# Patient Record
Sex: Female | Born: 1970 | Race: White | Hispanic: No | Marital: Married | Smoking: Never smoker
Health system: Southern US, Community
[De-identification: ages and names within clinical notes are randomized; demographics above are authoritative.]

## PROBLEM LIST (undated history)

## (undated) HISTORY — PX: TUBAL LIGATION: SHX77

## (undated) HISTORY — PX: OTHER SURGICAL HISTORY: SHX169

---

## 2002-04-30 ENCOUNTER — Emergency Department (HOSPITAL_COMMUNITY): Admission: EM | Admit: 2002-04-30 | Discharge: 2002-04-30 | Payer: Self-pay | Admitting: Emergency Medicine

## 2003-08-12 ENCOUNTER — Emergency Department (HOSPITAL_COMMUNITY): Admission: EM | Admit: 2003-08-12 | Discharge: 2003-08-12 | Payer: Self-pay | Admitting: Emergency Medicine

## 2003-09-01 ENCOUNTER — Other Ambulatory Visit: Admission: RE | Admit: 2003-09-01 | Discharge: 2003-09-01 | Payer: Self-pay | Admitting: Obstetrics & Gynecology

## 2004-09-09 ENCOUNTER — Other Ambulatory Visit: Admission: RE | Admit: 2004-09-09 | Discharge: 2004-09-09 | Payer: Self-pay | Admitting: Obstetrics & Gynecology

## 2007-08-06 ENCOUNTER — Ambulatory Visit (HOSPITAL_COMMUNITY): Admission: RE | Admit: 2007-08-06 | Discharge: 2007-08-06 | Payer: Self-pay | Admitting: Family Medicine

## 2011-02-20 ENCOUNTER — Other Ambulatory Visit: Payer: Self-pay | Admitting: Obstetrics & Gynecology

## 2011-02-20 DIAGNOSIS — R928 Other abnormal and inconclusive findings on diagnostic imaging of breast: Secondary | ICD-10-CM

## 2011-02-27 ENCOUNTER — Ambulatory Visit
Admission: RE | Admit: 2011-02-27 | Discharge: 2011-02-27 | Disposition: A | Discharge status: Home / Self Care | Payer: BC Managed Care – PPO | Source: Ambulatory Visit | Attending: Obstetrics & Gynecology | Admitting: Obstetrics & Gynecology

## 2011-02-27 DIAGNOSIS — R928 Other abnormal and inconclusive findings on diagnostic imaging of breast: Secondary | ICD-10-CM

## 2013-06-22 ENCOUNTER — Encounter (INDEPENDENT_AMBULATORY_CARE_PROVIDER_SITE_OTHER): Payer: Self-pay | Admitting: *Deleted

## 2013-07-12 ENCOUNTER — Encounter (INDEPENDENT_AMBULATORY_CARE_PROVIDER_SITE_OTHER): Payer: Self-pay | Admitting: Internal Medicine

## 2013-07-12 ENCOUNTER — Ambulatory Visit (INDEPENDENT_AMBULATORY_CARE_PROVIDER_SITE_OTHER): Payer: BC Managed Care – PPO | Admitting: Internal Medicine

## 2013-07-12 VITALS — BP 116/78 | HR 68 | Temp 98.2°F | Ht 65.0 in | Wt 201.8 lb

## 2013-07-12 DIAGNOSIS — Z800 Family history of malignant neoplasm of digestive organs: Secondary | ICD-10-CM

## 2013-07-12 DIAGNOSIS — K625 Hemorrhage of anus and rectum: Secondary | ICD-10-CM | POA: Insufficient documentation

## 2013-07-12 NOTE — Progress Notes (Signed)
Subjective:     Patient ID: Kathy Dennis, female   DOB: 06/10/1970, 43 y.o.   MRN: [id number]  HPI Referred to our office by Dr Regino SchultzeMcGough for diarrhea. Back in May she states she had stomach cramps and diarrhea. She says she saw some blood. Her symptoms lasted about X 1 week for about a week. Occurred around mid May.  She was having multiple stools. She saw blood around the stool.   She did have a fever with her symptoms. All her symptoms are now gone. She tells me she had a maternal grandmother to die from colon cancer at age 44-65.Died in her 770s with reoccurrence. Appetite is good. She has lost 13 pounds intentionally. No abdominal pain.  She is having 1 BM daily or every other day. Occasionally sees mucous once every 3 weeks (clear-brownish in color). No pain with the mucous. No anitibiotics.  05/21/2013 ALP 102, AST 21, ALT 20,total bilirubin 0.5, albumin 4.2, Glucose 128.  H and H 12.7 and 38,.4, WBC ct 7.4, platelet ct 241.   05/23/2013 C -diff negative, No ova or para Review of Systems History reviewed. No pertinent past medical history.  Past Surgical History  Procedure Laterality Date  . Tubal ligation      1998    Allergies  Allergen Reactions  . Sulfa Antibiotics     rash    No current outpatient prescriptions on file prior to visit.   No current facility-administered medications on file prior to visit.        Objective:   Physical Exam  Filed Vitals:   07/12/13 1423  BP: 116/78  Pulse: 68  Temp: 98.2 F (36.8 C)  Height: 5\' 5"  (1.651 m)  Weight: 201 lb 12.8 oz (91.536 kg)  Alert and oriented. Skin warm and dry. Oral mucosa is moist.   . Sclera anicteric, conjunctivae is pink. Thyroid not enlarged. No cervical lymphadenopathy. Lungs clear. Heart regular rate and rhythm.  Abdomen is soft. Bowel sounds are positive. No hepatomegaly. No abdominal masses felt. No tenderness.  No edema to lower extremities.  Stool brown and guaiac negative.      Assessment:   Probably gastroenteritis which has now resolved.  Rectal bleeding probably related to her gastroenteritis which has now resolved. Family hx of colon cancer in a maternal grandmother Patient would like to proceed with a colonoscopy given family hx of colon cancer. I discussed with Dr. Karilyn Cotaehman     Plan:     Colonoscopy with Dr. Karilyn Cotaehman. The risks and benefits such as perforation, bleeding, and infection were reviewed with the patient and is agreeable.

## 2013-07-12 NOTE — Patient Instructions (Signed)
Colonoscopy with Dr. Rehman. The risks and benefits such as perforation, bleeding, and infection were reviewed with the patient and is agreeable. 

## 2013-07-25 ENCOUNTER — Encounter (INDEPENDENT_AMBULATORY_CARE_PROVIDER_SITE_OTHER): Payer: Self-pay | Admitting: *Deleted

## 2013-07-25 ENCOUNTER — Other Ambulatory Visit (INDEPENDENT_AMBULATORY_CARE_PROVIDER_SITE_OTHER): Payer: Self-pay | Admitting: *Deleted

## 2013-07-25 ENCOUNTER — Telehealth (INDEPENDENT_AMBULATORY_CARE_PROVIDER_SITE_OTHER): Payer: Self-pay | Admitting: *Deleted

## 2013-07-25 DIAGNOSIS — Z800 Family history of malignant neoplasm of digestive organs: Secondary | ICD-10-CM

## 2013-07-25 DIAGNOSIS — Z1211 Encounter for screening for malignant neoplasm of colon: Secondary | ICD-10-CM

## 2013-07-25 DIAGNOSIS — K625 Hemorrhage of anus and rectum: Secondary | ICD-10-CM

## 2013-07-25 NOTE — Telephone Encounter (Signed)
Patient needs movi prep 

## 2013-07-26 MED ORDER — PEG-KCL-NACL-NASULF-NA ASC-C 100 G PO SOLR: 1.0000 | Freq: Once | ORAL | Status: DC

## 2013-08-01 ENCOUNTER — Encounter (HOSPITAL_COMMUNITY): Payer: Self-pay | Admitting: Pharmacy Technician

## 2013-08-04 ENCOUNTER — Ambulatory Visit (HOSPITAL_COMMUNITY)
Admission: RE | Admit: 2013-08-04 | Discharge: 2013-08-04 | Disposition: A | Discharge status: Home / Self Care | Payer: BC Managed Care – PPO | Source: Ambulatory Visit | Attending: Internal Medicine | Admitting: Internal Medicine

## 2013-08-04 ENCOUNTER — Encounter (HOSPITAL_COMMUNITY): Payer: Self-pay | Admitting: *Deleted

## 2013-08-04 ENCOUNTER — Encounter (HOSPITAL_COMMUNITY)
Admission: RE | Disposition: A | Discharge status: Home / Self Care | Payer: Self-pay | Source: Ambulatory Visit | Attending: Internal Medicine

## 2013-08-04 DIAGNOSIS — Z800 Family history of malignant neoplasm of digestive organs: Secondary | ICD-10-CM

## 2013-08-04 DIAGNOSIS — K921 Melena: Secondary | ICD-10-CM | POA: Insufficient documentation

## 2013-08-04 DIAGNOSIS — K644 Residual hemorrhoidal skin tags: Secondary | ICD-10-CM

## 2013-08-04 DIAGNOSIS — K625 Hemorrhage of anus and rectum: Secondary | ICD-10-CM

## 2013-08-04 HISTORY — PX: COLONOSCOPY: SHX5424

## 2013-08-04 SURGERY — COLONOSCOPY
Anesthesia: Moderate Sedation

## 2013-08-04 MED ORDER — MEPERIDINE HCL 50 MG/ML IJ SOLN
INTRAMUSCULAR | Status: AC
  Filled 2013-08-04: qty 1

## 2013-08-04 MED ORDER — MEPERIDINE HCL 50 MG/ML IJ SOLN
INTRAMUSCULAR | Status: DC | PRN
  Administered 2013-08-04 (×2): 25 mg via INTRAVENOUS

## 2013-08-04 MED ORDER — MIDAZOLAM HCL 5 MG/5ML IJ SOLN
INTRAMUSCULAR | Status: DC | PRN
  Administered 2013-08-04 (×5): 2 mg via INTRAVENOUS

## 2013-08-04 MED ORDER — MIDAZOLAM HCL 5 MG/5ML IJ SOLN
INTRAMUSCULAR | Status: AC
  Filled 2013-08-04: qty 10

## 2013-08-04 MED ORDER — STERILE WATER FOR IRRIGATION IR SOLN
Status: DC | PRN
  Administered 2013-08-04: 11:00:00

## 2013-08-04 MED ORDER — SODIUM CHLORIDE 0.9 % IV SOLN
INTRAVENOUS | Status: DC
  Administered 2013-08-04: 10:00:00 via INTRAVENOUS

## 2013-08-04 NOTE — H&P (Signed)
Kathy Dennis is an 43 y.o. female.   Chief Complaint: Patient is here for colonoscopy. HPI: Patient is a 43 year old Caucasian female present for diagnostic colonoscopy. She had an episode of tarry in May 2015 and passed some blood per she noted blood with her bowel movements even in her diarrhea resolved. She is here for undergoing colonoscopy. He denies history of hemorrhoids. She has good appetite. She has lost 15 pounds this year by trying. History significant for colon carcinoma in maternal grandmother who was in early 61s and died second primary 41 years later.  History reviewed. No pertinent past medical history.  Past Surgical History  Procedure Laterality Date  . Tubal ligation      1998  . D & c      History reviewed. No pertinent family history. Social History:  reports that she has never smoked. She does not have any smokeless tobacco history on file. She reports that she does not drink alcohol or use illicit drugs.  Allergies:  Allergies  Allergen Reactions  . Sulfa Antibiotics     rash    Medications Prior to Admission  Medication Sig Dispense Refill  . peg 3350 powder (MOVIPREP) 100 G SOLR Take 1 kit (200 g total) by mouth once.  1 kit  0  . EPINEPHrine (EPIPEN) 0.3 mg/0.3 mL IJ SOAJ injection Inject 0.3 mg into the muscle once as needed (allergic reaction).        No results found for this or any previous visit (from the past 48 hour(s)). No results found.  ROS  Blood pressure 125/79, pulse 67, temperature 97.8 F (36.6 C), temperature source Oral, resp. rate 18, last menstrual period 08/04/2013, SpO2 96.00%. Physical Exam  Constitutional: She appears well-developed and well-nourished.  HENT:  Mouth/Throat: Oropharynx is clear and moist.  Eyes: Conjunctivae are normal. No scleral icterus.  Neck: No thyromegaly present.  Cardiovascular: Normal rate, regular rhythm and normal heart sounds.   No murmur heard. Respiratory: Effort normal and breath sounds  normal.  GI: Soft. She exhibits no distension and no mass. There is no tenderness.  Musculoskeletal: She exhibits no edema.  Lymphadenopathy:    She has no cervical adenopathy.  Neurological: She is alert.  Skin: Skin is warm and dry.     Assessment/Plan Rectal bleeding. Diagnostic colonoscopy.  Kathy Dennis 08/04/2013, 10:39 AM

## 2013-08-04 NOTE — Op Note (Signed)
COLONOSCOPY PROCEDURE REPORT  PATIENT:  Kathy Dennis  MR#:  [id number] Birthdate:  07/21/1970, 43 y.o., female Endoscopist:  Dr. Malissa HippoNajeeb U. Areeba Sulser, MD Referred By:  Dr. Kirk RuthsWilliam M. McGough, MD Procedure Date: 08/04/2013  Procedure:   Colonoscopy  Indications:  Patient is 43 year old Caucasian female who had an episode of diarrhea but rectal bleeding. She had a few more episodes of rectal bleeding even though her diarrhea has resolved. She is therefore undergoing diagnostic colonoscopy. Family history significant for colon carcinoma in maternal grandmother.  Informed Consent:  The procedure and risks were reviewed with the patient and informed consent was obtained.  Medications:  Demerol 50 mg IV Versed 10 mg IV  Description of procedure:  After a digital rectal exam was performed, that colonoscope was advanced from the anus through the rectum and colon to the area of the cecum, ileocecal valve and appendiceal orifice. The cecum was deeply intubated. These structures were well-seen and photographed for the record. From the level of the cecum and ileocecal valve, the scope was slowly and cautiously withdrawn. The mucosal surfaces were carefully surveyed utilizing scope tip to flexion to facilitate fold flattening as needed. The scope was pulled down into the rectum where a thorough exam including retroflexion was performed. Terminal ileum was also examined.  Findings:   Prep excellent. Normal mucosa of terminal ileum. Normal mucosa of the cecum, ascending colon, hepatic flexure, transverse colon, splenic flexure, descending and sigmoid colon. Normal mucosa rectum. Small hemorrhoids below the dentate line.    Therapeutic/Diagnostic Maneuvers Performed:   None  Complications:  None  Cecal Withdrawal Time:  8  minutes  Impression:  Normal mucosa of terminal ileum. Normal anoscopy except small external hemorrhoids.  Recommendations:  Standard instructions given. Patient can wait 10  years before her next colonoscopy.  Lear Carstens U  08/04/2013 11:22 AM  CC: Dr. Kirk RuthsMCGOUGH,WILLIAM M, MD & Dr. Bonnetta BarryNo ref. provider found

## 2013-08-04 NOTE — Discharge Instructions (Signed)
Resume usual medications and diet. No driving for 24 hours. Next exam in 10 years unless any history changes.

## 2013-08-05 ENCOUNTER — Encounter (HOSPITAL_COMMUNITY): Payer: Self-pay | Admitting: Internal Medicine

## 2014-01-03 ENCOUNTER — Encounter (INDEPENDENT_AMBULATORY_CARE_PROVIDER_SITE_OTHER): Payer: Self-pay

## 2015-07-30 ENCOUNTER — Emergency Department (HOSPITAL_COMMUNITY): Payer: Commercial Managed Care - PPO

## 2015-07-30 ENCOUNTER — Encounter (HOSPITAL_COMMUNITY): Payer: Self-pay | Admitting: Emergency Medicine

## 2015-07-30 ENCOUNTER — Emergency Department (HOSPITAL_COMMUNITY)
Admission: EM | Admit: 2015-07-30 | Discharge: 2015-07-30 | Disposition: A | Discharge status: Home / Self Care | Payer: Commercial Managed Care - PPO | Attending: Emergency Medicine | Admitting: Emergency Medicine

## 2015-07-30 DIAGNOSIS — W268XXA Contact with other sharp object(s), not elsewhere classified, initial encounter: Secondary | ICD-10-CM | POA: Diagnosis not present

## 2015-07-30 DIAGNOSIS — Y929 Unspecified place or not applicable: Secondary | ICD-10-CM | POA: Diagnosis not present

## 2015-07-30 DIAGNOSIS — S99921A Unspecified injury of right foot, initial encounter: Secondary | ICD-10-CM | POA: Diagnosis present

## 2015-07-30 DIAGNOSIS — S91311A Laceration without foreign body, right foot, initial encounter: Secondary | ICD-10-CM | POA: Insufficient documentation

## 2015-07-30 DIAGNOSIS — Y939 Activity, unspecified: Secondary | ICD-10-CM | POA: Insufficient documentation

## 2015-07-30 DIAGNOSIS — Z23 Encounter for immunization: Secondary | ICD-10-CM | POA: Insufficient documentation

## 2015-07-30 DIAGNOSIS — Y999 Unspecified external cause status: Secondary | ICD-10-CM | POA: Insufficient documentation

## 2015-07-30 MED ORDER — TETANUS-DIPHTH-ACELL PERTUSSIS 5-2.5-18.5 LF-MCG/0.5 IM SUSP
0.5000 mL | Freq: Once | INTRAMUSCULAR | Status: AC
  Administered 2015-07-30: 0.5 mL via INTRAMUSCULAR
  Filled 2015-07-30: qty 0.5

## 2015-07-30 MED ORDER — HYDROCODONE-ACETAMINOPHEN 5-325 MG PO TABS: ORAL_TABLET | ORAL | Status: AC

## 2015-07-30 MED ORDER — IBUPROFEN 800 MG PO TABS: 800.0000 mg | ORAL_TABLET | Freq: Three times a day (TID) | ORAL | Status: AC

## 2015-07-30 MED ORDER — CEPHALEXIN 500 MG PO CAPS: 500.0000 mg | ORAL_CAPSULE | Freq: Four times a day (QID) | ORAL | Status: AC

## 2015-07-30 NOTE — Discharge Instructions (Signed)
Stitches, Staples, or Adhesive Wound Closure  Doctors use stitches (sutures), staples, and certain glue (skin adhesives) to hold your skin together while it heals (wound closure). You may need this treatment after you have surgery or if you cut your skin accidentally. These methods help your skin heal more quickly. They also make it less likely that you will have a scar.  WHAT ARE THE DIFFERENT KINDS OF WOUND CLOSURES?  There are many options for wound closure. The one that your doctor uses depends on how deep and large your wound is.  Adhesive Glue  To use this glue to close a wound, your doctor holds the edges of the wound together and paints the glue on the surface of your skin. You may need more than one layer of glue. Then the wound may be covered with a light bandage (dressing).  This type of skin closure may be used for small wounds that are not deep (superficial). Using glue for wound closure is less painful than other methods. It does not require a medicine that numbs the area. This method also leaves nothing to be removed. Adhesive glue is often used for children and on facial wounds.  Adhesive glue cannot be used for wounds that are deep, uneven, or bleeding. It is not used inside of a wound.   Adhesive Strips  These strips are made of sticky (adhesive), porous paper. They are placed across your skin edges like a regular adhesive bandage. You leave them on until they fall off.  Adhesive strips may be used to close very superficial wounds. They may also be used along with sutures to improve closure of your skin edges.   Sutures  Sutures are the oldest method of wound closure. Sutures can be made from natural or synthetic materials. They can be made from a material that your body can break down as your wound heals (absorbable), or they can be made from a material that needs to be removed from your skin (nonabsorbable). They come in many different strengths and sizes.  Your doctor attaches the sutures to a  steel needle on one end. Sutures can be passed through your skin, or through the tissues beneath your skin. Then they are tied and cut. Your skin edges may be closed in one continuous stitch or in separate stitches.  Sutures are strong and can be used for all kinds of wounds. Absorbable sutures may be used to close tissues under the skin. The disadvantage of sutures is that they may cause skin reactions that lead to infection. Nonabsorbable sutures need to be removed.  Staples  When surgical staples are used to close a wound, the edges of your skin on both sides of the wound are brought close together. A staple is placed across the wound, and an instrument secures the edges together. Staples are often used to close surgical cuts (incisions).  Staples are faster to use than sutures, and they cause less reaction from your skin. Staples need to be removed using a tool that bends the staples away from your skin.  HOW DO I CARE FOR MY WOUND CLOSURE?  · Take medicines only as told by your doctor.  · If you were prescribed an antibiotic medicine for your wound, finish it all even if you start to feel better.  · Use ointments or creams only as told by your doctor.  · Wash your hands with soap and water before and after touching your wound.  · Do not soak your wound in   water. Do not take baths, swim, or use a hot tub until your doctor says it is okay.  · Ask your doctor when you can start showering. Cover your wound if told by your doctor.  · Do not take out your own sutures or staples.  · Do not pick at your wound. Picking can cause an infection.  · Keep all follow-up visits as told by your doctor. This is important.  HOW LONG WILL I HAVE MY WOUND CLOSURE?   · Leave adhesive glue on your skin until the glue peels away.  · Leave adhesive strips on your skin until they fall off.  · Absorbable sutures will dissolve within several days.  · Nonabsorbable sutures and staples must be removed. The location of the wound will  determine how long they stay in. This can range from several days to a couple of weeks.  WHEN SHOULD I SEEK HELP FOR MY WOUND CLOSURE?  Contact your doctor if:  · You have a fever.  · You have chills.  · You have redness, puffiness (swelling), or pain at the site of your wound.  · You have fluid, blood, or pus coming from your wound.  · There is a bad smell coming from your wound.  · The skin edges of your wound start to separate after your sutures have been removed.  · Your wound becomes thick, raised, and darker in color after your sutures come out (scarring).     This information is not intended to replace advice given to you by your health care provider. Make sure you discuss any questions you have with your health care provider.     Document Released: 10/27/2008 Document Revised: 01/20/2014 Document Reviewed: 06/08/2013  Elsevier Interactive Patient Education ©2016 [employer]

## 2015-07-30 NOTE — ED Notes (Signed)
Patient states she stepped on a chair and it broke cutting the bottom of her right foot. Also complaining of pain to right 2nd and 3rd toe.

## 2015-07-30 NOTE — ED Provider Notes (Signed)
CSN: [id number]     Arrival date & time 07/30/15  1708 History  By signing my name below, I, Kathy Dennis, attest that this documentation has been prepared under the direction and in the presence of Kathy Hyun, PA-C. Electronically Signed: Alyssa GroveMartin Dennis, ED Scribe. 07/30/2015. 7:16 PM.   Chief Complaint  Patient presents with  . Foot Injury   HPI HPI Comments: Kathy Dennis is a 45 y.o. female who presents to the Emergency Department complaining of a right forefoot injury onset 5 PM today. Pt denies any other injuries or LOC. Pt states she stepped on a small ornamental chair when it broke and punctured the bottom of her foot underneath her toes. Pt reports bleeding was controlled with pressure bandage. Pt states they came straight to the ED and did not clean it. Pt reports associated 2nd and 3rd toe pain Pt denies any ankle pain, numbness and swelling. . Pt reports 2006 was the approximate year of the last Tetanus shot.  History reviewed. No pertinent past medical history. Past Surgical History  Procedure Laterality Date  . Tubal ligation      1998  . D & c    . Colonoscopy N/A 08/04/2013    Procedure: COLONOSCOPY;  Surgeon: Kathy HippoNajeeb U Rehman, MD;  Location: [facility];  Service: Endoscopy;  Laterality: N/A;  1025   History reviewed. No pertinent family history. Social History  Substance Use Topics  . Smoking status: Never Smoker   . Smokeless tobacco: None  . Alcohol Use: No   OB History    No data available     Review of Systems  Constitutional: Negative for fever and chills.  Musculoskeletal: Negative for back pain, joint swelling and arthralgias.  Skin: Positive for wound.       Laceration   Neurological: Negative for dizziness, weakness and numbness.  Hematological: Does not bruise/bleed easily.  All other systems reviewed and are negative.   Allergies  Sulfa antibiotics  Home Medications   Prior to Admission medications   Medication Sig Start Date End Date  Taking? Authorizing Provider  EPINEPHrine (EPIPEN) 0.3 mg/0.3 mL IJ SOAJ injection Inject 0.3 mg into the muscle once as needed (allergic reaction).    Historical Provider, MD   BP 141/68 mmHg  Pulse 82  Temp(Src) 98.1 F (36.7 C)  Resp 16  Ht 5\' 5"  (1.651 m)  Wt 205 lb (92.987 kg)  BMI 34.11 kg/m2  SpO2 100%  LMP 07/16/2015 Physical Exam  Constitutional: She is oriented to person, place, and time. She appears well-developed and well-nourished. No distress.  HENT:  Head: Normocephalic.  Eyes: Conjunctivae are normal. Pupils are equal, round, and reactive to light.  Neck: Normal range of motion. Neck supple.  Cardiovascular: Normal rate and regular rhythm.   Pulmonary/Chest: Effort normal.  Abdominal: Soft. Bowel sounds are normal.  Musculoskeletal: Normal range of motion.  Superficial 1cm laceration to the plantar aspect of the right foot and small abrasions also present No edema or bleeding Mild tenderness to the 2nd and 3rd toes without bony deformity   Neurological: She is alert and oriented to person, place, and time.  Skin: Skin is warm and dry. No rash noted.  Psychiatric: She has a normal mood and affect. Her behavior is normal.  Nursing note and vitals reviewed.   ED Course  Procedures (including critical care time)  DIAGNOSTIC STUDIES: Oxygen Saturation is 100% on RA, normal by my interpretation.    COORDINATION OF CARE: 7:12 PM Discussed treatment  plan with pt at bedside which includes DG Foot Complete Right and wound care and pt agreed to plan.  Imaging Review Dg Foot Complete Right  07/30/2015  CLINICAL DATA:  Right second and third toe pain. Laceration on plantar aspect of foot. EXAM: RIGHT FOOT COMPLETE - 3+ VIEW COMPARISON:  None. FINDINGS: There is no evidence of fracture or dislocation. There is no evidence of arthropathy or other focal bone abnormality. Soft tissues are unremarkable. Small plantar calcaneal enthesophyte. IMPRESSION: No fracture,  dislocation or advanced arthropathy of the right foot. Electronically Signed   By: Kathy Dennis M.D.   On: 07/30/2015 17:38   I have personally reviewed and evaluated these images as part of my medical decision-making.   EKG Interpretation None      MDM   Final diagnoses:  Laceration of foot, right, initial encounter   Superficial laceration to the plantar foot.  Wound explored, no FB's.  Bleeding controlled.  Td updated.  Wound irrigated and steri strips applied and bandaged.  Crutches given.  Pt agrees to wound care instructions.  Stable for d/c  I personally performed the services described in this documentation, which was scribed in my presence. The recorded information has been reviewed and is accurate.   Kathy Aus, PA-C 08/02/15 1154   Kathy Racer, MD 08/18/15 [phone]

## 2016-10-09 DIAGNOSIS — Z01419 Encounter for gynecological examination (general) (routine) without abnormal findings: Secondary | ICD-10-CM | POA: Diagnosis not present

## 2016-10-09 DIAGNOSIS — Z1231 Encounter for screening mammogram for malignant neoplasm of breast: Secondary | ICD-10-CM | POA: Diagnosis not present

## 2016-12-09 DIAGNOSIS — J069 Acute upper respiratory infection, unspecified: Secondary | ICD-10-CM | POA: Diagnosis not present

## 2017-01-29 DIAGNOSIS — J029 Acute pharyngitis, unspecified: Secondary | ICD-10-CM | POA: Diagnosis not present

## 2017-02-02 DIAGNOSIS — J029 Acute pharyngitis, unspecified: Secondary | ICD-10-CM | POA: Diagnosis not present

## 2017-03-25 DIAGNOSIS — H66002 Acute suppurative otitis media without spontaneous rupture of ear drum, left ear: Secondary | ICD-10-CM | POA: Diagnosis not present

## 2017-03-25 DIAGNOSIS — J019 Acute sinusitis, unspecified: Secondary | ICD-10-CM | POA: Diagnosis not present

## 2017-04-21 DIAGNOSIS — R350 Frequency of micturition: Secondary | ICD-10-CM | POA: Diagnosis not present

## 2017-04-21 DIAGNOSIS — N390 Urinary tract infection, site not specified: Secondary | ICD-10-CM | POA: Diagnosis not present

## 2017-10-23 DIAGNOSIS — Z1231 Encounter for screening mammogram for malignant neoplasm of breast: Secondary | ICD-10-CM | POA: Diagnosis not present

## 2017-10-23 DIAGNOSIS — R87610 Atypical squamous cells of undetermined significance on cytologic smear of cervix (ASC-US): Secondary | ICD-10-CM | POA: Diagnosis not present

## 2017-10-23 DIAGNOSIS — Z124 Encounter for screening for malignant neoplasm of cervix: Secondary | ICD-10-CM | POA: Diagnosis not present

## 2017-10-23 DIAGNOSIS — Z01419 Encounter for gynecological examination (general) (routine) without abnormal findings: Secondary | ICD-10-CM | POA: Diagnosis not present

## 2017-11-18 DIAGNOSIS — R87610 Atypical squamous cells of undetermined significance on cytologic smear of cervix (ASC-US): Secondary | ICD-10-CM | POA: Diagnosis not present

## 2017-11-18 DIAGNOSIS — N870 Mild cervical dysplasia: Secondary | ICD-10-CM | POA: Diagnosis not present

## 2017-11-18 DIAGNOSIS — N72 Inflammatory disease of cervix uteri: Secondary | ICD-10-CM | POA: Diagnosis not present

## 2018-05-26 DIAGNOSIS — E119 Type 2 diabetes mellitus without complications: Secondary | ICD-10-CM | POA: Diagnosis not present

## 2018-05-26 DIAGNOSIS — Z6831 Body mass index (BMI) 31.0-31.9, adult: Secondary | ICD-10-CM | POA: Diagnosis not present

## 2018-05-26 DIAGNOSIS — Z1389 Encounter for screening for other disorder: Secondary | ICD-10-CM | POA: Diagnosis not present

## 2018-05-26 DIAGNOSIS — E6609 Other obesity due to excess calories: Secondary | ICD-10-CM | POA: Diagnosis not present

## 2018-09-02 DIAGNOSIS — L821 Other seborrheic keratosis: Secondary | ICD-10-CM | POA: Diagnosis not present

## 2018-09-02 DIAGNOSIS — D1801 Hemangioma of skin and subcutaneous tissue: Secondary | ICD-10-CM | POA: Diagnosis not present

## 2018-09-02 DIAGNOSIS — L820 Inflamed seborrheic keratosis: Secondary | ICD-10-CM | POA: Diagnosis not present

## 2018-09-02 DIAGNOSIS — L814 Other melanin hyperpigmentation: Secondary | ICD-10-CM | POA: Diagnosis not present

## 2018-09-02 DIAGNOSIS — D225 Melanocytic nevi of trunk: Secondary | ICD-10-CM | POA: Diagnosis not present

## 2018-11-19 DIAGNOSIS — Z124 Encounter for screening for malignant neoplasm of cervix: Secondary | ICD-10-CM | POA: Diagnosis not present

## 2018-11-19 DIAGNOSIS — Z1231 Encounter for screening mammogram for malignant neoplasm of breast: Secondary | ICD-10-CM | POA: Diagnosis not present

## 2018-11-19 DIAGNOSIS — Z01419 Encounter for gynecological examination (general) (routine) without abnormal findings: Secondary | ICD-10-CM | POA: Diagnosis not present

## 2018-12-29 DIAGNOSIS — N870 Mild cervical dysplasia: Secondary | ICD-10-CM | POA: Diagnosis not present

## 2018-12-29 DIAGNOSIS — Z3202 Encounter for pregnancy test, result negative: Secondary | ICD-10-CM | POA: Diagnosis not present

## 2018-12-29 DIAGNOSIS — R87810 Cervical high risk human papillomavirus (HPV) DNA test positive: Secondary | ICD-10-CM | POA: Diagnosis not present

## 2018-12-29 DIAGNOSIS — B977 Papillomavirus as the cause of diseases classified elsewhere: Secondary | ICD-10-CM | POA: Diagnosis not present

## 2018-12-29 DIAGNOSIS — Z6829 Body mass index (BMI) 29.0-29.9, adult: Secondary | ICD-10-CM | POA: Diagnosis not present

## 2018-12-30 DIAGNOSIS — E119 Type 2 diabetes mellitus without complications: Secondary | ICD-10-CM | POA: Diagnosis not present

## 2018-12-30 DIAGNOSIS — E663 Overweight: Secondary | ICD-10-CM | POA: Diagnosis not present

## 2018-12-30 DIAGNOSIS — Z6829 Body mass index (BMI) 29.0-29.9, adult: Secondary | ICD-10-CM | POA: Diagnosis not present

## 2019-02-01 DIAGNOSIS — J9801 Acute bronchospasm: Secondary | ICD-10-CM | POA: Diagnosis not present

## 2019-02-01 DIAGNOSIS — U071 COVID-19: Secondary | ICD-10-CM | POA: Diagnosis not present

## 2019-02-01 DIAGNOSIS — Z681 Body mass index (BMI) 19 or less, adult: Secondary | ICD-10-CM | POA: Diagnosis not present

## 2019-06-30 ENCOUNTER — Other Ambulatory Visit: Payer: Self-pay

## 2019-06-30 ENCOUNTER — Ambulatory Visit (HOSPITAL_COMMUNITY)
Admission: RE | Admit: 2019-06-30 | Discharge: 2019-06-30 | Disposition: A | Discharge status: Home / Self Care | Payer: BC Managed Care – PPO | Source: Ambulatory Visit | Attending: Internal Medicine | Admitting: Internal Medicine

## 2019-06-30 ENCOUNTER — Other Ambulatory Visit (HOSPITAL_COMMUNITY): Payer: Self-pay | Admitting: Internal Medicine

## 2019-06-30 ENCOUNTER — Other Ambulatory Visit: Payer: Self-pay | Admitting: Internal Medicine

## 2019-06-30 DIAGNOSIS — M25552 Pain in left hip: Secondary | ICD-10-CM

## 2019-06-30 DIAGNOSIS — E1165 Type 2 diabetes mellitus with hyperglycemia: Secondary | ICD-10-CM | POA: Diagnosis not present

## 2019-06-30 DIAGNOSIS — M545 Low back pain, unspecified: Secondary | ICD-10-CM

## 2019-06-30 DIAGNOSIS — M1612 Unilateral primary osteoarthritis, left hip: Secondary | ICD-10-CM | POA: Diagnosis not present

## 2019-06-30 DIAGNOSIS — M25551 Pain in right hip: Secondary | ICD-10-CM

## 2019-06-30 DIAGNOSIS — R0989 Other specified symptoms and signs involving the circulatory and respiratory systems: Secondary | ICD-10-CM

## 2019-06-30 DIAGNOSIS — E663 Overweight: Secondary | ICD-10-CM

## 2019-06-30 DIAGNOSIS — Z0001 Encounter for general adult medical examination with abnormal findings: Secondary | ICD-10-CM | POA: Diagnosis not present

## 2019-06-30 DIAGNOSIS — M353 Polymyalgia rheumatica: Secondary | ICD-10-CM | POA: Diagnosis not present

## 2019-06-30 DIAGNOSIS — Z1389 Encounter for screening for other disorder: Secondary | ICD-10-CM | POA: Diagnosis not present

## 2019-06-30 DIAGNOSIS — Z6828 Body mass index (BMI) 28.0-28.9, adult: Secondary | ICD-10-CM | POA: Diagnosis not present

## 2019-06-30 DIAGNOSIS — M1611 Unilateral primary osteoarthritis, right hip: Secondary | ICD-10-CM | POA: Diagnosis not present

## 2019-07-06 ENCOUNTER — Other Ambulatory Visit: Payer: Self-pay

## 2019-07-06 ENCOUNTER — Encounter (HOSPITAL_COMMUNITY): Payer: Self-pay

## 2019-07-06 ENCOUNTER — Ambulatory Visit (HOSPITAL_COMMUNITY)
Admission: RE | Admit: 2019-07-06 | Discharge: 2019-07-06 | Disposition: A | Discharge status: Home / Self Care | Payer: BC Managed Care – PPO | Source: Ambulatory Visit | Attending: Internal Medicine | Admitting: Internal Medicine

## 2019-07-06 DIAGNOSIS — E663 Overweight: Secondary | ICD-10-CM | POA: Diagnosis not present

## 2019-07-06 DIAGNOSIS — R0989 Other specified symptoms and signs involving the circulatory and respiratory systems: Secondary | ICD-10-CM | POA: Insufficient documentation

## 2019-07-06 DIAGNOSIS — I6523 Occlusion and stenosis of bilateral carotid arteries: Secondary | ICD-10-CM | POA: Diagnosis not present

## 2019-09-06 DIAGNOSIS — L920 Granuloma annulare: Secondary | ICD-10-CM | POA: Diagnosis not present

## 2019-09-06 DIAGNOSIS — L814 Other melanin hyperpigmentation: Secondary | ICD-10-CM | POA: Diagnosis not present

## 2019-09-06 DIAGNOSIS — L821 Other seborrheic keratosis: Secondary | ICD-10-CM | POA: Diagnosis not present

## 2019-09-06 DIAGNOSIS — D225 Melanocytic nevi of trunk: Secondary | ICD-10-CM | POA: Diagnosis not present

## 2019-10-05 DIAGNOSIS — E663 Overweight: Secondary | ICD-10-CM | POA: Diagnosis not present

## 2019-10-05 DIAGNOSIS — Z6829 Body mass index (BMI) 29.0-29.9, adult: Secondary | ICD-10-CM | POA: Diagnosis not present

## 2019-10-05 DIAGNOSIS — L239 Allergic contact dermatitis, unspecified cause: Secondary | ICD-10-CM | POA: Diagnosis not present

## 2019-11-24 DIAGNOSIS — Z6829 Body mass index (BMI) 29.0-29.9, adult: Secondary | ICD-10-CM | POA: Diagnosis not present

## 2019-11-24 DIAGNOSIS — Z124 Encounter for screening for malignant neoplasm of cervix: Secondary | ICD-10-CM | POA: Diagnosis not present

## 2019-11-24 DIAGNOSIS — Z01419 Encounter for gynecological examination (general) (routine) without abnormal findings: Secondary | ICD-10-CM | POA: Diagnosis not present

## 2019-11-24 DIAGNOSIS — Z1231 Encounter for screening mammogram for malignant neoplasm of breast: Secondary | ICD-10-CM | POA: Diagnosis not present

## 2019-12-19 DIAGNOSIS — Z6828 Body mass index (BMI) 28.0-28.9, adult: Secondary | ICD-10-CM | POA: Diagnosis not present

## 2019-12-19 DIAGNOSIS — E119 Type 2 diabetes mellitus without complications: Secondary | ICD-10-CM | POA: Diagnosis not present

## 2019-12-19 DIAGNOSIS — E663 Overweight: Secondary | ICD-10-CM | POA: Diagnosis not present

## 2019-12-19 DIAGNOSIS — E669 Obesity, unspecified: Secondary | ICD-10-CM | POA: Diagnosis not present

## 2020-04-20 DIAGNOSIS — E669 Obesity, unspecified: Secondary | ICD-10-CM | POA: Diagnosis not present

## 2020-04-20 DIAGNOSIS — E1165 Type 2 diabetes mellitus with hyperglycemia: Secondary | ICD-10-CM | POA: Diagnosis not present

## 2020-04-20 DIAGNOSIS — Z6830 Body mass index (BMI) 30.0-30.9, adult: Secondary | ICD-10-CM | POA: Diagnosis not present

## 2020-04-20 DIAGNOSIS — E119 Type 2 diabetes mellitus without complications: Secondary | ICD-10-CM | POA: Diagnosis not present

## 2020-05-07 DIAGNOSIS — M25561 Pain in right knee: Secondary | ICD-10-CM | POA: Diagnosis not present

## 2020-05-07 DIAGNOSIS — M1611 Unilateral primary osteoarthritis, right hip: Secondary | ICD-10-CM | POA: Diagnosis not present

## 2020-05-07 DIAGNOSIS — M545 Low back pain, unspecified: Secondary | ICD-10-CM | POA: Diagnosis not present

## 2020-05-26 DIAGNOSIS — Z20822 Contact with and (suspected) exposure to covid-19: Secondary | ICD-10-CM | POA: Diagnosis not present

## 2020-06-28 DIAGNOSIS — E663 Overweight: Secondary | ICD-10-CM | POA: Diagnosis not present

## 2020-06-28 DIAGNOSIS — Z6829 Body mass index (BMI) 29.0-29.9, adult: Secondary | ICD-10-CM | POA: Diagnosis not present

## 2020-06-28 DIAGNOSIS — N342 Other urethritis: Secondary | ICD-10-CM | POA: Diagnosis not present

## 2020-07-12 DIAGNOSIS — E663 Overweight: Secondary | ICD-10-CM | POA: Diagnosis not present

## 2020-07-12 DIAGNOSIS — E119 Type 2 diabetes mellitus without complications: Secondary | ICD-10-CM | POA: Diagnosis not present

## 2020-07-12 DIAGNOSIS — Z23 Encounter for immunization: Secondary | ICD-10-CM | POA: Diagnosis not present

## 2020-07-12 DIAGNOSIS — Z0000 Encounter for general adult medical examination without abnormal findings: Secondary | ICD-10-CM | POA: Diagnosis not present

## 2020-07-12 DIAGNOSIS — Z6829 Body mass index (BMI) 29.0-29.9, adult: Secondary | ICD-10-CM | POA: Diagnosis not present

## 2020-07-17 DIAGNOSIS — E875 Hyperkalemia: Secondary | ICD-10-CM | POA: Diagnosis not present

## 2020-08-17 IMAGING — US US CAROTID DUPLEX BILAT
1 series · 13 of 24 positions shown · non-contrast
Comparison: None.

CLINICAL DATA: 49-year-old female with abnormal chest sounds, bruit

EXAM:
BILATERAL CAROTID DUPLEX ULTRASOUND
TECHNIQUE: Gray scale imaging, color Doppler and duplex ultrasound were
performed of bilateral carotid and vertebral arteries in the neck.

[Series 1: us carotid bilateral · 13 of 68 slices shown]
[im 1/68]
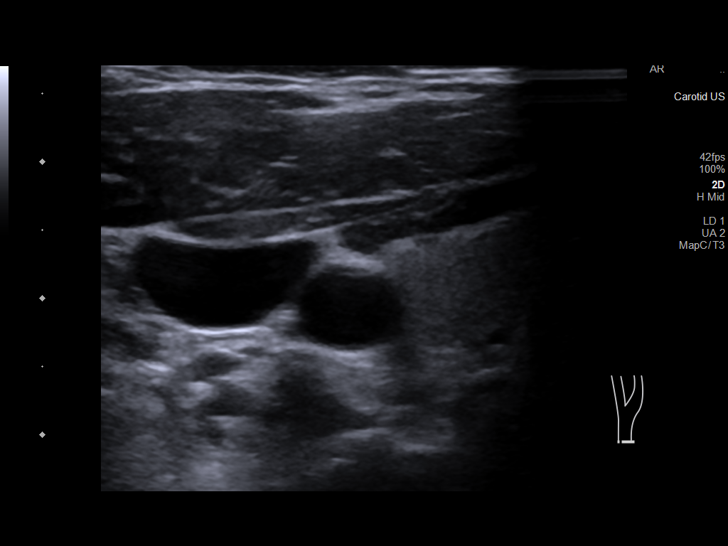
[im 6/68]
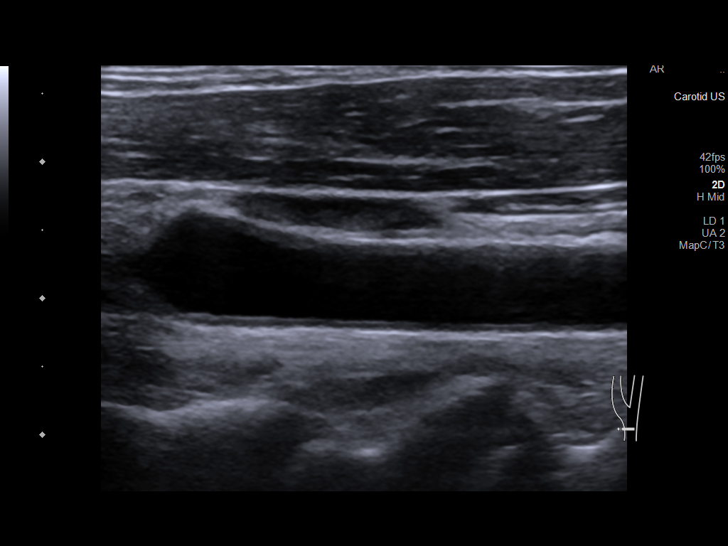
[im 12/68]
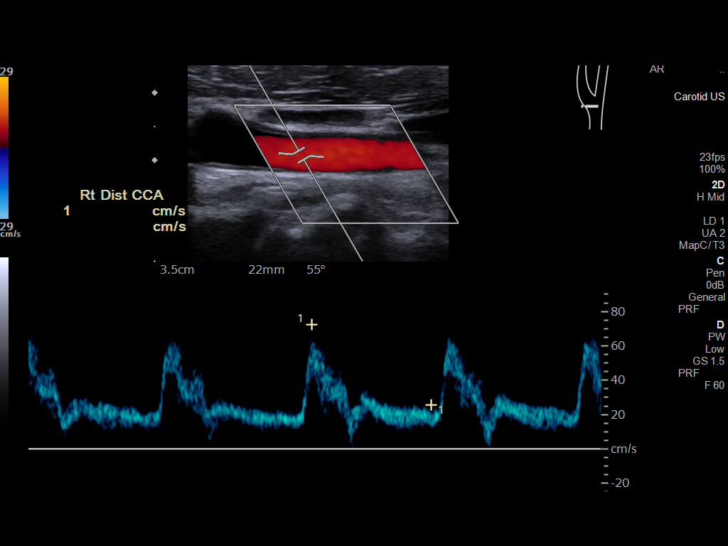
[im 18/68]
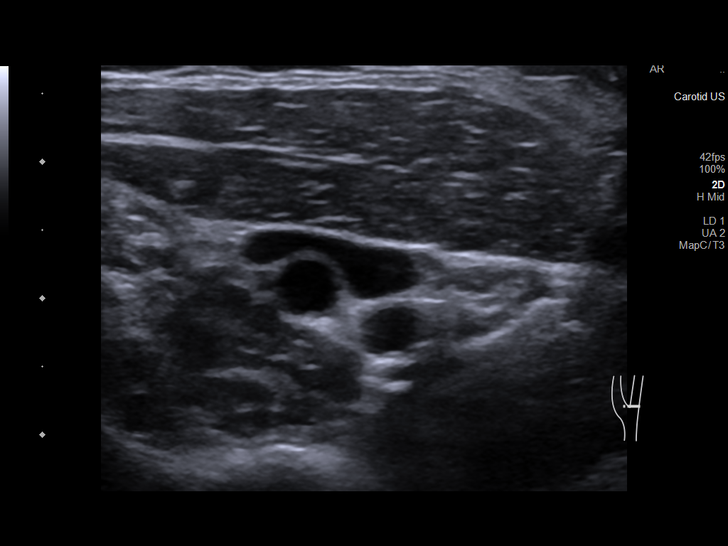
[im 24/68]
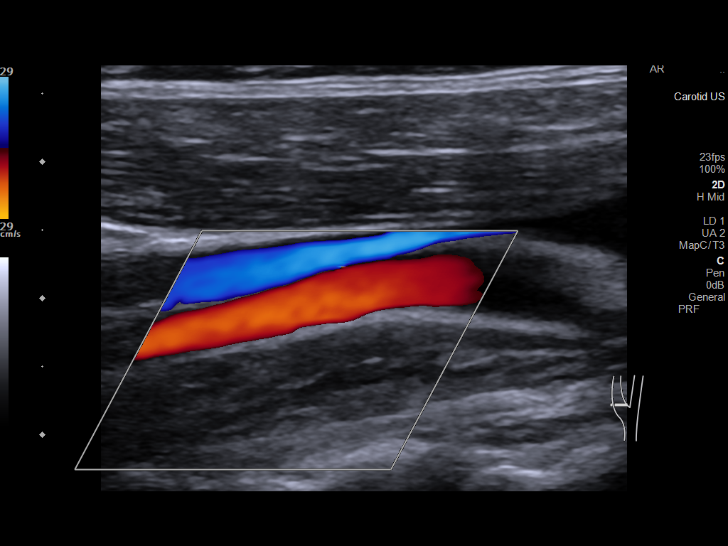
[im 30/68]
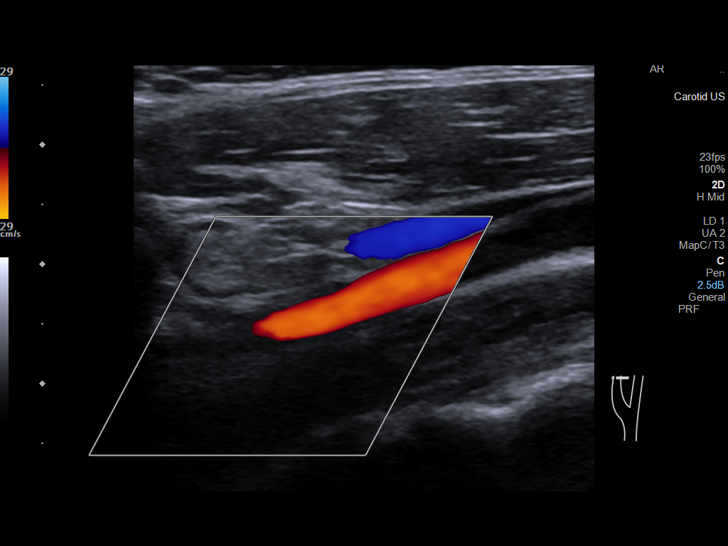
[im 35/68]
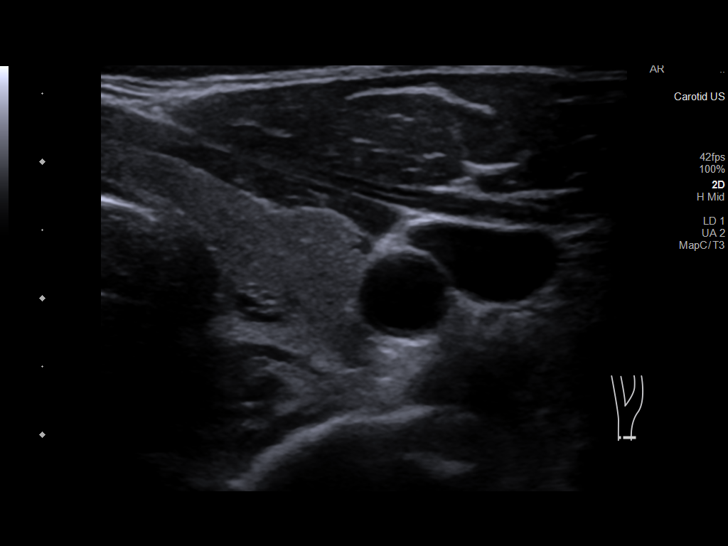
[im 38/68]
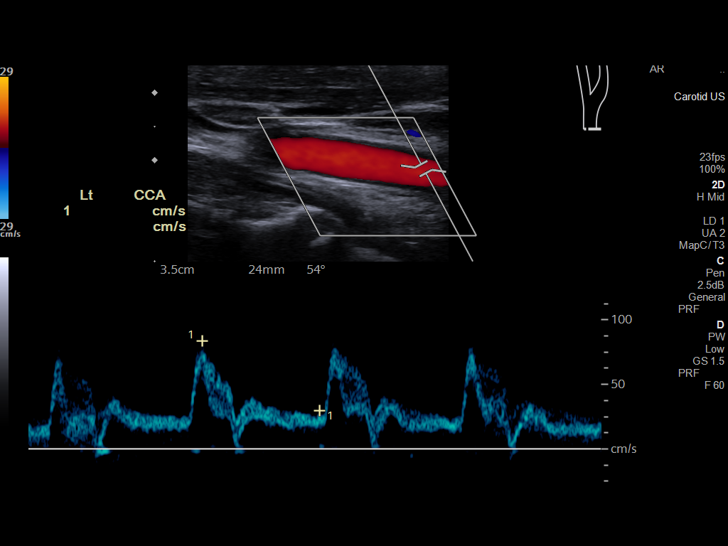
[im 44/68]
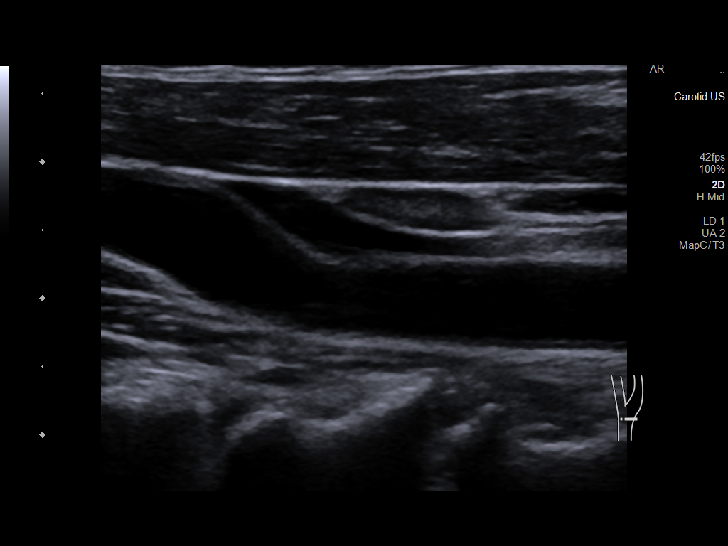
[im 50/68]
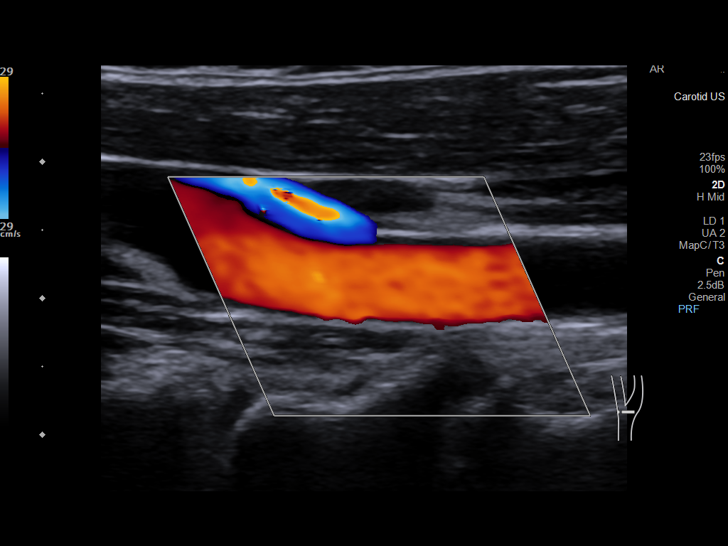
[im 56/68]
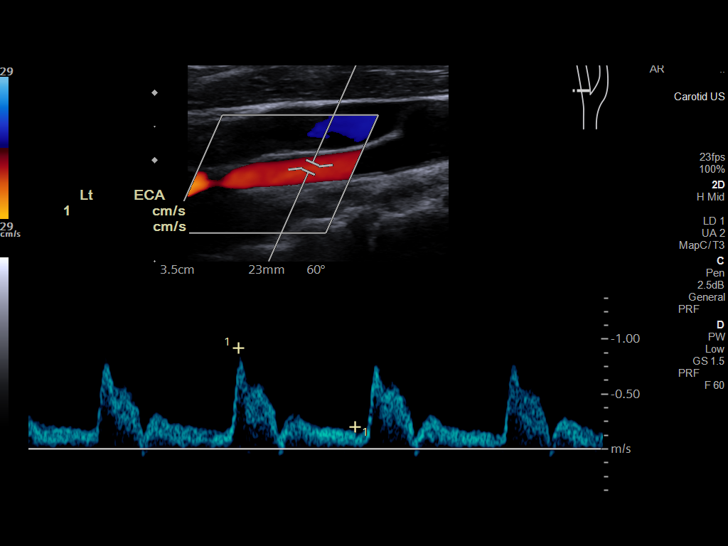
[im 62/68]
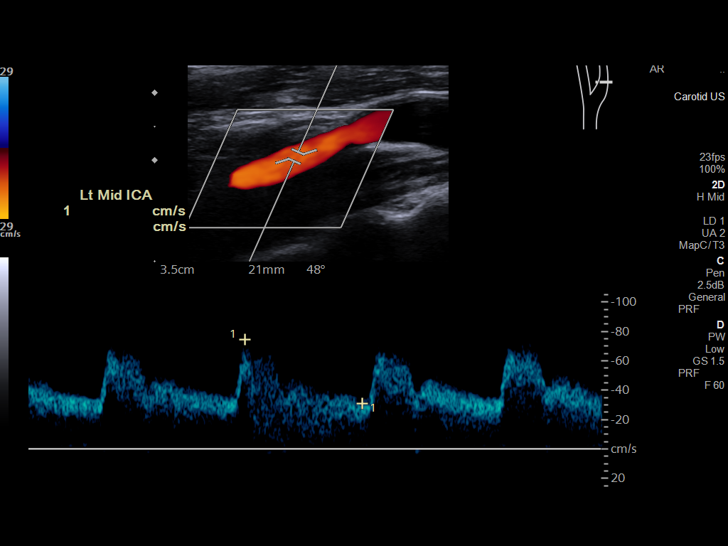
[im 68/68]
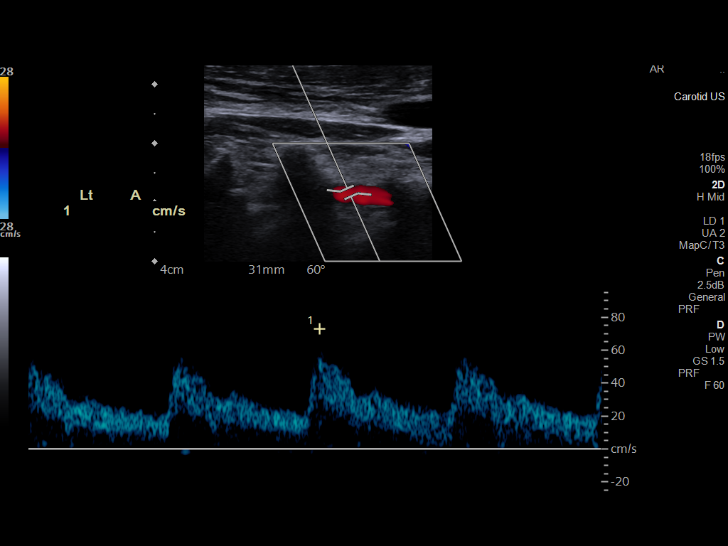

[13 of 24 positions shown; findings below may reference images not displayed]

FINDINGS: Criteria: Quantification of carotid stenosis is based on velocity
parameters that correlate the residual internal carotid diameter
with NASCET-based stenosis levels, using the diameter of the distal
internal carotid lumen as the denominator for stenosis measurement.

The following velocity measurements were obtained:

RIGHT

ICA:  Systolic 79 cm/sec, Diastolic 37 cm/sec

CCA:  92 cm/sec

SYSTOLIC ICA/CCA RATIO:

ECA:  105 cm/sec

LEFT

ICA:  Systolic 85 cm/sec, Diastolic 37 cm/sec

CCA:  83 cm/sec

SYSTOLIC ICA/CCA RATIO:

ECA:  72 cm/sec

Right Brachial SBP: Not acquired

Left Brachial SBP: Not acquired

RIGHT CAROTID ARTERY: No significant calcified disease of the right
common carotid artery. Intermediate waveform maintained.
Heterogeneous plaque without significant calcifications at the right
carotid bifurcation. Low resistance waveform of the right ICA. No
significant tortuosity.

RIGHT VERTEBRAL ARTERY: Antegrade flow with low resistance waveform.

LEFT CAROTID ARTERY: No significant calcified disease of the left
common carotid artery. Intermediate waveform maintained.
Heterogeneous plaque at the left carotid bifurcation without
significant calcifications. Low resistance waveform of the left ICA.

LEFT VERTEBRAL ARTERY:  Antegrade flow with low resistance waveform.
IMPRESSION: Color duplex indicates minimal heterogeneous plaque, with no
hemodynamically significant stenosis by duplex criteria in the
extracranial cerebrovascular circulation.

## 2020-09-06 DIAGNOSIS — L821 Other seborrheic keratosis: Secondary | ICD-10-CM | POA: Diagnosis not present

## 2020-09-06 DIAGNOSIS — D225 Melanocytic nevi of trunk: Secondary | ICD-10-CM | POA: Diagnosis not present

## 2020-09-06 DIAGNOSIS — L814 Other melanin hyperpigmentation: Secondary | ICD-10-CM | POA: Diagnosis not present

## 2020-09-06 DIAGNOSIS — D485 Neoplasm of uncertain behavior of skin: Secondary | ICD-10-CM | POA: Diagnosis not present

## 2020-09-06 DIAGNOSIS — L920 Granuloma annulare: Secondary | ICD-10-CM | POA: Diagnosis not present

## 2020-09-06 DIAGNOSIS — D2371 Other benign neoplasm of skin of right lower limb, including hip: Secondary | ICD-10-CM | POA: Diagnosis not present

## 2020-12-31 DIAGNOSIS — Z124 Encounter for screening for malignant neoplasm of cervix: Secondary | ICD-10-CM | POA: Diagnosis not present

## 2020-12-31 DIAGNOSIS — Z01419 Encounter for gynecological examination (general) (routine) without abnormal findings: Secondary | ICD-10-CM | POA: Diagnosis not present

## 2020-12-31 DIAGNOSIS — R87810 Cervical high risk human papillomavirus (HPV) DNA test positive: Secondary | ICD-10-CM | POA: Diagnosis not present

## 2021-01-01 DIAGNOSIS — Z6829 Body mass index (BMI) 29.0-29.9, adult: Secondary | ICD-10-CM | POA: Diagnosis not present

## 2021-01-01 DIAGNOSIS — E119 Type 2 diabetes mellitus without complications: Secondary | ICD-10-CM | POA: Diagnosis not present

## 2021-01-01 DIAGNOSIS — Z23 Encounter for immunization: Secondary | ICD-10-CM | POA: Diagnosis not present

## 2021-01-01 DIAGNOSIS — F419 Anxiety disorder, unspecified: Secondary | ICD-10-CM | POA: Diagnosis not present

## 2021-01-01 DIAGNOSIS — E669 Obesity, unspecified: Secondary | ICD-10-CM | POA: Diagnosis not present

## 2021-01-21 ENCOUNTER — Other Ambulatory Visit: Payer: Self-pay | Admitting: Obstetrics

## 2021-01-21 DIAGNOSIS — Z1231 Encounter for screening mammogram for malignant neoplasm of breast: Secondary | ICD-10-CM

## 2021-01-29 DIAGNOSIS — Z1231 Encounter for screening mammogram for malignant neoplasm of breast: Secondary | ICD-10-CM | POA: Diagnosis not present

## 2021-01-29 DIAGNOSIS — R87610 Atypical squamous cells of undetermined significance on cytologic smear of cervix (ASC-US): Secondary | ICD-10-CM | POA: Diagnosis not present

## 2021-01-29 DIAGNOSIS — Z6830 Body mass index (BMI) 30.0-30.9, adult: Secondary | ICD-10-CM | POA: Diagnosis not present

## 2021-08-07 DIAGNOSIS — Z0001 Encounter for general adult medical examination with abnormal findings: Secondary | ICD-10-CM | POA: Diagnosis not present

## 2021-08-07 DIAGNOSIS — E119 Type 2 diabetes mellitus without complications: Secondary | ICD-10-CM | POA: Diagnosis not present

## 2021-08-07 DIAGNOSIS — Z6830 Body mass index (BMI) 30.0-30.9, adult: Secondary | ICD-10-CM | POA: Diagnosis not present

## 2021-08-07 DIAGNOSIS — E6609 Other obesity due to excess calories: Secondary | ICD-10-CM | POA: Diagnosis not present

## 2021-09-30 DIAGNOSIS — M545 Low back pain, unspecified: Secondary | ICD-10-CM | POA: Diagnosis not present

## 2021-09-30 DIAGNOSIS — M1611 Unilateral primary osteoarthritis, right hip: Secondary | ICD-10-CM | POA: Diagnosis not present

## 2021-10-07 DIAGNOSIS — L821 Other seborrheic keratosis: Secondary | ICD-10-CM | POA: Diagnosis not present

## 2021-10-07 DIAGNOSIS — L814 Other melanin hyperpigmentation: Secondary | ICD-10-CM | POA: Diagnosis not present

## 2021-10-07 DIAGNOSIS — D2371 Other benign neoplasm of skin of right lower limb, including hip: Secondary | ICD-10-CM | POA: Diagnosis not present

## 2021-10-07 DIAGNOSIS — L700 Acne vulgaris: Secondary | ICD-10-CM | POA: Diagnosis not present

## 2021-11-18 DIAGNOSIS — M1611 Unilateral primary osteoarthritis, right hip: Secondary | ICD-10-CM | POA: Diagnosis not present

## 2021-11-29 DIAGNOSIS — E663 Overweight: Secondary | ICD-10-CM | POA: Diagnosis not present

## 2021-11-29 DIAGNOSIS — E119 Type 2 diabetes mellitus without complications: Secondary | ICD-10-CM | POA: Diagnosis not present

## 2021-11-29 DIAGNOSIS — Z6828 Body mass index (BMI) 28.0-28.9, adult: Secondary | ICD-10-CM | POA: Diagnosis not present

## 2021-12-16 DIAGNOSIS — M25561 Pain in right knee: Secondary | ICD-10-CM | POA: Diagnosis not present

## 2021-12-16 DIAGNOSIS — M1611 Unilateral primary osteoarthritis, right hip: Secondary | ICD-10-CM | POA: Diagnosis not present

## 2021-12-30 DIAGNOSIS — M25551 Pain in right hip: Secondary | ICD-10-CM | POA: Diagnosis not present

## 2021-12-30 DIAGNOSIS — M6281 Muscle weakness (generalized): Secondary | ICD-10-CM | POA: Diagnosis not present

## 2022-01-02 DIAGNOSIS — M1611 Unilateral primary osteoarthritis, right hip: Secondary | ICD-10-CM | POA: Diagnosis not present

## 2022-01-14 DIAGNOSIS — M6281 Muscle weakness (generalized): Secondary | ICD-10-CM | POA: Diagnosis not present

## 2022-01-14 DIAGNOSIS — M25551 Pain in right hip: Secondary | ICD-10-CM | POA: Diagnosis not present

## 2022-01-16 DIAGNOSIS — M25551 Pain in right hip: Secondary | ICD-10-CM | POA: Diagnosis not present

## 2022-01-16 DIAGNOSIS — M6281 Muscle weakness (generalized): Secondary | ICD-10-CM | POA: Diagnosis not present

## 2022-01-20 DIAGNOSIS — M25551 Pain in right hip: Secondary | ICD-10-CM | POA: Diagnosis not present

## 2022-01-20 DIAGNOSIS — M6281 Muscle weakness (generalized): Secondary | ICD-10-CM | POA: Diagnosis not present

## 2022-02-12 DIAGNOSIS — Z1231 Encounter for screening mammogram for malignant neoplasm of breast: Secondary | ICD-10-CM | POA: Diagnosis not present

## 2022-02-12 DIAGNOSIS — Z01419 Encounter for gynecological examination (general) (routine) without abnormal findings: Secondary | ICD-10-CM | POA: Diagnosis not present

## 2022-02-12 DIAGNOSIS — Z6829 Body mass index (BMI) 29.0-29.9, adult: Secondary | ICD-10-CM | POA: Diagnosis not present

## 2022-02-12 DIAGNOSIS — Z8742 Personal history of other diseases of the female genital tract: Secondary | ICD-10-CM | POA: Diagnosis not present

## 2022-09-23 DIAGNOSIS — H2000 Unspecified acute and subacute iridocyclitis: Secondary | ICD-10-CM | POA: Diagnosis not present

## 2022-09-23 DIAGNOSIS — H04123 Dry eye syndrome of bilateral lacrimal glands: Secondary | ICD-10-CM | POA: Diagnosis not present

## 2022-09-29 DIAGNOSIS — H2000 Unspecified acute and subacute iridocyclitis: Secondary | ICD-10-CM | POA: Diagnosis not present

## 2022-09-29 DIAGNOSIS — H04123 Dry eye syndrome of bilateral lacrimal glands: Secondary | ICD-10-CM | POA: Diagnosis not present

## 2022-10-03 DIAGNOSIS — L905 Scar conditions and fibrosis of skin: Secondary | ICD-10-CM | POA: Diagnosis not present

## 2022-10-03 DIAGNOSIS — L814 Other melanin hyperpigmentation: Secondary | ICD-10-CM | POA: Diagnosis not present

## 2022-10-03 DIAGNOSIS — L821 Other seborrheic keratosis: Secondary | ICD-10-CM | POA: Diagnosis not present

## 2022-10-03 DIAGNOSIS — D225 Melanocytic nevi of trunk: Secondary | ICD-10-CM | POA: Diagnosis not present

## 2022-10-08 DIAGNOSIS — Z9229 Personal history of other drug therapy: Secondary | ICD-10-CM | POA: Diagnosis not present

## 2022-10-08 DIAGNOSIS — Z6829 Body mass index (BMI) 29.0-29.9, adult: Secondary | ICD-10-CM | POA: Diagnosis not present

## 2022-10-08 DIAGNOSIS — Z0001 Encounter for general adult medical examination with abnormal findings: Secondary | ICD-10-CM | POA: Diagnosis not present

## 2022-10-08 DIAGNOSIS — E663 Overweight: Secondary | ICD-10-CM | POA: Diagnosis not present

## 2022-10-08 DIAGNOSIS — E1165 Type 2 diabetes mellitus with hyperglycemia: Secondary | ICD-10-CM | POA: Diagnosis not present

## 2022-10-08 DIAGNOSIS — Z1331 Encounter for screening for depression: Secondary | ICD-10-CM | POA: Diagnosis not present

## 2022-10-08 DIAGNOSIS — E119 Type 2 diabetes mellitus without complications: Secondary | ICD-10-CM | POA: Diagnosis not present

## 2022-10-20 DIAGNOSIS — H04123 Dry eye syndrome of bilateral lacrimal glands: Secondary | ICD-10-CM | POA: Diagnosis not present

## 2022-10-20 DIAGNOSIS — H2000 Unspecified acute and subacute iridocyclitis: Secondary | ICD-10-CM | POA: Diagnosis not present

## 2022-11-26 ENCOUNTER — Encounter (INDEPENDENT_AMBULATORY_CARE_PROVIDER_SITE_OTHER): Payer: Self-pay | Admitting: *Deleted

## 2022-12-26 ENCOUNTER — Telehealth (INDEPENDENT_AMBULATORY_CARE_PROVIDER_SITE_OTHER): Payer: Self-pay | Admitting: Gastroenterology

## 2022-12-26 NOTE — Telephone Encounter (Signed)
Ok to schedule.  Room 3  Thanks,  Vista Lawman, MD Gastroenterology and Hepatology [facility]

## 2022-12-26 NOTE — Telephone Encounter (Signed)
Who is your primary care physician: Dr.Fusco  Reasons for the colonoscopy: recall  Have you had a colonoscopy before?  Yes 2013  Do you have family history of colon cancer? Yes grandmother on mothers side  Previous colonoscopy with polyps removed? Yes 2013  Do you have a history colorectal cancer?   no  Are you diabetic? If yes, Type 1 or Type 2?    Yes type 2  Do you have a prosthetic or mechanical heart valve? no  Do you have a pacemaker/defibrillator?   no  Have you had endocarditis/atrial fibrillation? no  Have you had joint replacement within the last 12 months?  Yes full hip 01/02/22  Do you tend to be constipated or have to use laxatives? no  Do you have any history of drugs or alchohol?  no  Do you use supplemental oxygen?  no  Have you had a stroke or heart attack within the last 6 months? no  Do you take weight loss medication?  no  For female patients: have you had a hysterectomy?  no                                     are you post menopausal?       yes                                            do you still have your menstrual cycle? no      Do you take any blood-thinning medications such as: (aspirin, warfarin, Plavix, Aggrenox)  yes  If yes we need the name, milligram, dosage and who is prescribing doctor aspirin 81mg   1 per day Current Outpatient Medications on File Prior to Visit  Medication Sig Dispense Refill   aspirin 81 MG chewable tablet CHEW AND SWALLOW 1 TABLET BY MOUTH TWICE DAILY FOR BLOOD CLOT PREVENTION     atorvastatin (LIPITOR) 20 MG tablet Take 20 mg by mouth daily.     cephALEXin (KEFLEX) 500 MG capsule Take 1 capsule (500 mg total) by mouth 4 (four) times daily. For 7 days (Patient not taking: Reported on 12/26/2022) 28 capsule 0   EPINEPHrine (EPIPEN) 0.3 mg/0.3 mL IJ SOAJ injection Inject 0.3 mg into the muscle once as needed (allergic reaction).     HYDROcodone-acetaminophen (NORCO/VICODIN) 5-325 MG tablet Take one tab po q 4-6 hrs prn  pain (Patient not taking: Reported on 12/26/2022) 10 tablet 0   ibuprofen (ADVIL,MOTRIN) 800 MG tablet Take 1 tablet (800 mg total) by mouth 3 (three) times daily. (Patient not taking: Reported on 12/26/2022) 21 tablet 0   lisinopril (ZESTRIL) 2.5 MG tablet Take 2.5 mg by mouth daily.     meloxicam (MOBIC) 15 MG tablet TAKE 1 TABLET BY MOUTH EVERY DAY AS NEEDED FOR PAIN     metFORMIN (GLUCOPHAGE) 500 MG tablet Take 1 tablet by mouth 3 (three) times daily.     No current facility-administered medications on file prior to visit.    Allergies  Allergen Reactions   Sulfa Antibiotics     rash     Pharmacy: [facility] [address]  Primary Insurance Name: [employer] [employer] [place]  Best number where you can be reached: [phone]

## 2022-12-26 NOTE — Telephone Encounter (Signed)
Correction :  Ok to schedule.  Room Any  Thanks,  Vista Lawman, MD Gastroenterology and Hepatology [facility]

## 2023-01-02 DIAGNOSIS — M1611 Unilateral primary osteoarthritis, right hip: Secondary | ICD-10-CM | POA: Diagnosis not present

## 2023-01-09 NOTE — Telephone Encounter (Signed)
Left message to return call 

## 2023-01-13 ENCOUNTER — Encounter (INDEPENDENT_AMBULATORY_CARE_PROVIDER_SITE_OTHER): Payer: Self-pay | Admitting: *Deleted

## 2023-01-13 DIAGNOSIS — A493 Mycoplasma infection, unspecified site: Secondary | ICD-10-CM | POA: Diagnosis not present

## 2023-01-13 DIAGNOSIS — Z6830 Body mass index (BMI) 30.0-30.9, adult: Secondary | ICD-10-CM | POA: Diagnosis not present

## 2023-01-13 DIAGNOSIS — E6609 Other obesity due to excess calories: Secondary | ICD-10-CM | POA: Diagnosis not present

## 2023-01-13 DIAGNOSIS — R6889 Other general symptoms and signs: Secondary | ICD-10-CM | POA: Diagnosis not present

## 2023-01-13 DIAGNOSIS — Z20828 Contact with and (suspected) exposure to other viral communicable diseases: Secondary | ICD-10-CM | POA: Diagnosis not present

## 2023-01-13 MED ORDER — PEG 3350-KCL-NA BICARB-NACL 420 G PO SOLR: 4000.0000 mL | Freq: Once | ORAL | 0 refills | Status: AC

## 2023-01-13 NOTE — Telephone Encounter (Signed)
Pt contacted and scheduled for 02/13/23 at 8:15am. Instructions mailed to patient. Prep sent to pharmacy

## 2023-01-13 NOTE — Addendum Note (Signed)
Addended by: Marlowe Shores on: 01/13/2023 09:58 AM   Modules accepted: Orders

## 2023-01-13 NOTE — Telephone Encounter (Signed)
 Referral completed, [facility] apt letter sent to PCP

## 2023-01-21 DIAGNOSIS — R6889 Other general symptoms and signs: Secondary | ICD-10-CM | POA: Diagnosis not present

## 2023-01-21 DIAGNOSIS — U071 COVID-19: Secondary | ICD-10-CM | POA: Diagnosis not present

## 2023-01-21 DIAGNOSIS — Z20828 Contact with and (suspected) exposure to other viral communicable diseases: Secondary | ICD-10-CM | POA: Diagnosis not present

## 2023-01-21 DIAGNOSIS — Z6830 Body mass index (BMI) 30.0-30.9, adult: Secondary | ICD-10-CM | POA: Diagnosis not present

## 2023-01-21 DIAGNOSIS — E1165 Type 2 diabetes mellitus with hyperglycemia: Secondary | ICD-10-CM | POA: Diagnosis not present

## 2023-01-21 DIAGNOSIS — E6609 Other obesity due to excess calories: Secondary | ICD-10-CM | POA: Diagnosis not present

## 2023-02-13 ENCOUNTER — Encounter (INDEPENDENT_AMBULATORY_CARE_PROVIDER_SITE_OTHER): Payer: Self-pay | Admitting: *Deleted

## 2023-02-13 ENCOUNTER — Ambulatory Visit (HOSPITAL_COMMUNITY): Payer: BC Managed Care – PPO | Admitting: Certified Registered Nurse Anesthetist

## 2023-02-13 ENCOUNTER — Ambulatory Visit (HOSPITAL_COMMUNITY)
Admission: RE | Admit: 2023-02-13 | Discharge: 2023-02-13 | Disposition: A | Discharge status: Home / Self Care | Payer: BC Managed Care – PPO | Attending: Gastroenterology | Admitting: Gastroenterology

## 2023-02-13 ENCOUNTER — Encounter (HOSPITAL_COMMUNITY): Payer: Self-pay | Admitting: Gastroenterology

## 2023-02-13 ENCOUNTER — Other Ambulatory Visit: Payer: Self-pay

## 2023-02-13 ENCOUNTER — Encounter (HOSPITAL_COMMUNITY)
Admission: RE | Disposition: A | Discharge status: Home / Self Care | Payer: Self-pay | Source: Home / Self Care | Attending: Gastroenterology

## 2023-02-13 DIAGNOSIS — Z1211 Encounter for screening for malignant neoplasm of colon: Secondary | ICD-10-CM | POA: Insufficient documentation

## 2023-02-13 DIAGNOSIS — K648 Other hemorrhoids: Secondary | ICD-10-CM | POA: Diagnosis not present

## 2023-02-13 DIAGNOSIS — Q438 Other specified congenital malformations of intestine: Secondary | ICD-10-CM | POA: Insufficient documentation

## 2023-02-13 DIAGNOSIS — I10 Essential (primary) hypertension: Secondary | ICD-10-CM | POA: Insufficient documentation

## 2023-02-13 DIAGNOSIS — K644 Residual hemorrhoidal skin tags: Secondary | ICD-10-CM | POA: Insufficient documentation

## 2023-02-13 DIAGNOSIS — K641 Second degree hemorrhoids: Secondary | ICD-10-CM

## 2023-02-13 HISTORY — PX: COLONOSCOPY WITH PROPOFOL: SHX5780

## 2023-02-13 LAB — GLUCOSE, CAPILLARY: Glucose-Capillary: 152 mg/dL — ABNORMAL HIGH (ref 70–99)

## 2023-02-13 LAB — HM COLONOSCOPY

## 2023-02-13 SURGERY — COLONOSCOPY WITH PROPOFOL
Anesthesia: General

## 2023-02-13 MED ORDER — LACTATED RINGERS IV SOLN: INTRAVENOUS | Status: DC

## 2023-02-13 MED ORDER — PROPOFOL 500 MG/50ML IV EMUL
INTRAVENOUS | Status: DC | PRN
  Administered 2023-02-13: 60 mg via INTRAVENOUS
  Administered 2023-02-13: 150 ug/kg/min via INTRAVENOUS

## 2023-02-13 MED ORDER — PHENYLEPHRINE 80 MCG/ML (10ML) SYRINGE FOR IV PUSH (FOR BLOOD PRESSURE SUPPORT)
PREFILLED_SYRINGE | INTRAVENOUS | Status: DC | PRN
  Administered 2023-02-13: 80 ug via INTRAVENOUS

## 2023-02-13 MED ORDER — PHENYLEPHRINE 80 MCG/ML (10ML) SYRINGE FOR IV PUSH (FOR BLOOD PRESSURE SUPPORT)
PREFILLED_SYRINGE | INTRAVENOUS | Status: AC
  Filled 2023-02-13: qty 10

## 2023-02-13 MED ORDER — PROPOFOL 500 MG/50ML IV EMUL
INTRAVENOUS | Status: AC
  Filled 2023-02-13: qty 50

## 2023-02-13 NOTE — Discharge Instructions (Signed)

## 2023-02-13 NOTE — Anesthesia Postprocedure Evaluation (Signed)
Anesthesia Post Note  Patient: Kathy Dennis  Procedure(s) Performed: COLONOSCOPY WITH PROPOFOL  Patient location during evaluation: Phase II Anesthesia Type: General Level of consciousness: awake Pain management: pain level controlled Vital Signs Assessment: post-procedure vital signs reviewed and stable Respiratory status: spontaneous breathing and respiratory function stable Cardiovascular status: blood pressure returned to baseline and stable Postop Assessment: no headache and no apparent nausea or vomiting Anesthetic complications: no Comments: Late entry   No notable events documented.   Last Vitals:  Vitals:   02/13/23 0732 02/13/23 0812  BP: 125/78 108/63  Pulse: 84 91  Resp: 17 16  Temp: 36.8 C 36.4 C  SpO2: 97% 99%    Last Pain:  Vitals:   02/13/23 0812  TempSrc: Oral  PainSc: 0-No pain                 Windell Norfolk

## 2023-02-13 NOTE — H&P (Signed)
Primary Care Physician:  Elfredia Nevins, MD Primary Gastroenterologist:  Dr. Tasia Catchings  Pre-Procedure History & Physical: HPI:  Kathy Dennis is a 53 y.o. female is here for a colonoscopy for colon cancer screening purposes.  Patient denies any family history of colorectal cancer.  No melena or hematochezia.  No abdominal pain or unintentional weight loss.  No change in bowel habits.  Overall feels well from a GI standpoint.  History reviewed. No pertinent past medical history.  Past Surgical History:  Procedure Laterality Date   COLONOSCOPY N/A 08/04/2013   Procedure: COLONOSCOPY;  Surgeon: Malissa Hippo, MD;  Location: [facility];  Service: Endoscopy;  Laterality: N/A;  1025   d & c     TUBAL LIGATION     1998    Prior to Admission medications   Medication Sig Start Date End Date Taking? Authorizing Provider  aspirin 81 MG chewable tablet CHEW AND SWALLOW 1 TABLET BY MOUTH TWICE DAILY FOR BLOOD CLOT PREVENTION   Yes [provider]  atorvastatin (LIPITOR) 20 MG tablet Take 20 mg by mouth daily.   Yes [provider]  lisinopril (ZESTRIL) 2.5 MG tablet Take 2.5 mg by mouth daily.   Yes [provider]  meloxicam (MOBIC) 15 MG tablet TAKE 1 TABLET BY MOUTH EVERY DAY AS NEEDED FOR PAIN   Yes [provider]  metFORMIN (GLUCOPHAGE) 500 MG tablet Take 1 tablet by mouth 3 (three) times daily.   Yes [provider]  cephALEXin (KEFLEX) 500 MG capsule Take 1 capsule (500 mg total) by mouth 4 (four) times daily. For 7 days Patient not taking: Reported on 12/26/2022 07/30/15   Pauline Aus, PA-C  EPINEPHrine (EPIPEN) 0.3 mg/0.3 mL IJ SOAJ injection Inject 0.3 mg into the muscle once as needed (allergic reaction).    [provider]  HYDROcodone-acetaminophen (NORCO/VICODIN) 5-325 MG tablet Take one tab po q 4-6 hrs prn pain Patient not taking: Reported on 12/26/2022 07/30/15   Triplett, Tammy, PA-C  ibuprofen (ADVIL,MOTRIN) 800 MG tablet  Take 1 tablet (800 mg total) by mouth 3 (three) times daily. Patient not taking: Reported on 12/26/2022 07/30/15   Pauline Aus, PA-C    Allergies as of 01/13/2023 - Review Complete 12/26/2022  Allergen Reaction Noted   Sulfa antibiotics  07/12/2013    History reviewed. No pertinent family history.  Social History   Socioeconomic History   Marital status: Married    Spouse name: Not on file   Number of children: Not on file   Years of education: Not on file   Highest education level: Not on file  Occupational History   Not on file  Tobacco Use   Smoking status: Never   Smokeless tobacco: Not on file  Substance and Sexual Activity   Alcohol use: No   Drug use: No   Sexual activity: Not on file  Other Topics Concern   Not on file  Social History Narrative   Not on file   Social Drivers of Health   Financial Resource Strain: Not on file  Food Insecurity: Not on file  Transportation Needs: Not on file  Physical Activity: Not on file  Stress: Not on file  Social Connections: Not on file  Intimate Partner Violence: Not on file    Review of Systems: See HPI, otherwise negative ROS  Physical Exam: Vital signs in last 24 hours:     General:   Alert,  Well-developed, well-nourished, pleasant and cooperative in NAD Head:  Normocephalic and  atraumatic. Eyes:  Sclera clear, no icterus.   Conjunctiva pink. Ears:  Normal auditory acuity. Nose:  No deformity, discharge,  or lesions. Msk:  Symmetrical without gross deformities. Normal posture. Extremities:  Without clubbing or edema. Neurologic:  Alert and  oriented x4;  grossly normal neurologically. Skin:  Intact without significant lesions or rashes. Psych:  Alert and cooperative. Normal mood and affect.  Impression/Plan: Kathy Dennis is here for a colonoscopy to be performed for colon cancer screening purposes.  The risks of the procedure including infection, bleed, or perforation as well as benefits,  limitations, alternatives and imponderables have been reviewed with the patient. Questions have been answered. All parties agreeable.

## 2023-02-13 NOTE — Anesthesia Preprocedure Evaluation (Signed)

## 2023-02-13 NOTE — Transfer of Care (Signed)
Immediate Anesthesia Transfer of Care Note  Patient: Kathy Dennis  Procedure(s) Performed: COLONOSCOPY WITH PROPOFOL  Patient Location: Endoscopy Unit  Anesthesia Type:General  Level of Consciousness: awake, alert , and oriented  Airway & Oxygen Therapy: Patient Spontanous Breathing  Post-op Assessment: Report given to RN, Post -op Vital signs reviewed and stable, Patient moving all extremities X 4, and Patient able to stick tongue midline  Post vital signs: Reviewed and stable  Last Vitals:  Vitals Value Taken Time  BP 108/63   Temp 97.6   Pulse 87   Resp 10   SpO2 100     Last Pain:  Vitals:   02/13/23 0732  TempSrc: Oral  PainSc: 0-No pain      Patients Stated Pain Goal: 7 (02/13/23 0732)  Complications: No notable events documented.

## 2023-02-13 NOTE — Op Note (Signed)
[facility] Patient Name: Kathy Dennis Procedure Date: 02/13/2023 7:16 AM MRN: [id number] Date of Birth: 1970/02/05 Attending MD: Sanjuan Dame , MD, [id number] CSN: [id number] Age: 53 Admit Type: Outpatient Procedure:                Colonoscopy Indications:              Screening for colorectal malignant neoplasm Providers:                Sanjuan Dame, MD, Nena Polio, RN, Lennice Sites                            Technician, Technician Referring MD:              Medicines:                Monitored Anesthesia Care Complications:            No immediate complications. Estimated Blood Loss:     Estimated blood loss: none. Procedure:                Pre-Anesthesia Assessment:                           - Prior to the procedure, a History and Physical                            was performed, and patient medications and                            allergies were reviewed. The patient's tolerance of                            previous anesthesia was also reviewed. The risks                            and benefits of the procedure and the sedation                            options and risks were discussed with the patient.                            All questions were answered, and informed consent                            was obtained. Prior Anticoagulants: The patient has                            taken no anticoagulant or antiplatelet agents. ASA                            Grade Assessment: II - A patient with mild systemic                            disease. After reviewing the risks and benefits,  the patient was deemed in satisfactory condition to                            undergo the procedure.                           After obtaining informed consent, the colonoscope                            was passed under direct vision. Throughout the                            procedure, the patient's blood pressure, pulse, and                            oxygen  saturations were monitored continuously. The                            [id number]) scope was introduced through                            the anus and advanced to the the cecum, identified                            by appendiceal orifice and ileocecal valve. The                            colonoscopy was performed without difficulty. The                            patient tolerated the procedure well. The quality                            of the bowel preparation was evaluated using the                            BBPS [facility] Bowel Preparation Scale) with scores                            of: Right Colon = 3, Transverse Colon = 3 and Left                            Colon = 3 (entire mucosa seen well with no residual                            staining, small fragments of stool or opaque                            liquid). The total BBPS score equals 9. The                            ileocecal valve, appendiceal orifice, and rectum  were photographed. Scope In: 7:51:21 AM Scope Out: 8:09:15 AM Scope Withdrawal Time: 0 hours 11 minutes 5 seconds  Total Procedure Duration: 0 hours 17 minutes 54 seconds  Findings:      The right colon was tortuous.      Non-bleeding external and internal hemorrhoids were found during       retroflexion. The hemorrhoids were small.      The exam was otherwise without abnormality. Impression:               - Tortuous colon.                           - Non-bleeding external and internal hemorrhoids.                           - The examination was otherwise normal.                           - No specimens collected. Moderate Sedation:      Per Anesthesia Care Recommendation:           - Patient has a contact number available for                            emergencies. The signs and symptoms of potential                            delayed complications were discussed with the                            patient. Return to  normal activities tomorrow.                            Written discharge instructions were provided to the                            patient.                           - Resume previous diet.                           - Continue present medications.                           - Repeat colonoscopy in 10 years for screening                            purposes.                           - Return to primary care physician as previously                            scheduled. Procedure Code(s):        --- Professional ---  W0981, Colorectal cancer screening; colonoscopy on                            individual not meeting criteria for high risk Diagnosis Code(s):        --- Professional ---                           Z12.11, Encounter for screening for malignant                            neoplasm of colon                           K64.8, Other hemorrhoids                           Q43.8, Other specified congenital malformations of                            intestine CPT copyright 2022 American Medical Association. All rights reserved. The codes documented in this report are preliminary and upon coder review may  be revised to meet current compliance requirements. Sanjuan Dame, MD Sanjuan Dame, MD 02/13/2023 8:16:14 AM This report has been signed electronically. Number of Addenda: 0

## 2023-02-16 ENCOUNTER — Encounter (HOSPITAL_COMMUNITY): Payer: Self-pay | Admitting: Gastroenterology

## 2023-05-04 ENCOUNTER — Encounter (INDEPENDENT_AMBULATORY_CARE_PROVIDER_SITE_OTHER): Admitting: Ophthalmology

## 2023-05-11 DIAGNOSIS — Z01419 Encounter for gynecological examination (general) (routine) without abnormal findings: Secondary | ICD-10-CM | POA: Diagnosis not present

## 2023-05-11 DIAGNOSIS — Z1231 Encounter for screening mammogram for malignant neoplasm of breast: Secondary | ICD-10-CM | POA: Diagnosis not present

## 2023-05-11 DIAGNOSIS — Z8742 Personal history of other diseases of the female genital tract: Secondary | ICD-10-CM | POA: Diagnosis not present

## 2023-07-30 DIAGNOSIS — R87810 Cervical high risk human papillomavirus (HPV) DNA test positive: Secondary | ICD-10-CM | POA: Diagnosis not present

## 2023-09-10 DIAGNOSIS — Z1331 Encounter for screening for depression: Secondary | ICD-10-CM | POA: Diagnosis not present

## 2023-09-10 DIAGNOSIS — Z0001 Encounter for general adult medical examination with abnormal findings: Secondary | ICD-10-CM | POA: Diagnosis not present

## 2023-09-10 DIAGNOSIS — Z6830 Body mass index (BMI) 30.0-30.9, adult: Secondary | ICD-10-CM | POA: Diagnosis not present

## 2023-09-10 DIAGNOSIS — E6609 Other obesity due to excess calories: Secondary | ICD-10-CM | POA: Diagnosis not present

## 2023-09-10 DIAGNOSIS — E1165 Type 2 diabetes mellitus with hyperglycemia: Secondary | ICD-10-CM | POA: Diagnosis not present

## 2023-10-09 DIAGNOSIS — T07XXXA Unspecified multiple injuries, initial encounter: Secondary | ICD-10-CM | POA: Diagnosis not present

## 2023-10-09 DIAGNOSIS — L821 Other seborrheic keratosis: Secondary | ICD-10-CM | POA: Diagnosis not present

## 2023-10-09 DIAGNOSIS — L814 Other melanin hyperpigmentation: Secondary | ICD-10-CM | POA: Diagnosis not present

## 2023-10-09 DIAGNOSIS — L309 Dermatitis, unspecified: Secondary | ICD-10-CM | POA: Diagnosis not present

## 2023-10-26 DIAGNOSIS — H2513 Age-related nuclear cataract, bilateral: Secondary | ICD-10-CM | POA: Diagnosis not present

## 2023-10-26 DIAGNOSIS — D487 Neoplasm of uncertain behavior of other specified sites: Secondary | ICD-10-CM | POA: Diagnosis not present

## 2023-10-26 DIAGNOSIS — H04123 Dry eye syndrome of bilateral lacrimal glands: Secondary | ICD-10-CM | POA: Diagnosis not present

## 2023-10-27 ENCOUNTER — Other Ambulatory Visit: Payer: Self-pay | Admitting: Medical Genetics

## 2023-10-27 NOTE — Progress Notes (Addendum)
 [facility] - Clinic Note  11/02/2023   CHIEF COMPLAINT Patient presents for Retina Evaluation  HISTORY OF PRESENT ILLNESS: Kathy Dennis is a 53 y.o. female who presents to the clinic today for:  HPI     Retina Evaluation   In left eye.  I, the attending physician,  performed the HPI with the patient and updated documentation appropriately.        Comments   Retina eval per Dr. GORMAN Gaudy. Patient first time for a full exam with Dr GORMAN Gaudy. Patient states no vision problems. 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 Patient has also seen Dr. Darroll. Patient states she has known about the Nevus for couple years. Patient is also type 2 Diabetic. A1C 7.6      Last edited by Valdemar Rogue, MD on 11/02/2023  9:43 AM.     Patient feels her vision is doing well. She knew she has a freckle in the left eye.   Referring physician: Gaudy Charlie Hamilton, MD [address] [place],  [place] [address]  HISTORICAL INFORMATION:  Selected notes from the MEDICAL RECORD NUMBER Referred by Dr. JAMA:  Ocular Hx- PMH-   CURRENT MEDICATIONS: No current outpatient medications on file. (Ophthalmic Drugs)   No current facility-administered medications for this visit. (Ophthalmic Drugs)   Current Outpatient Medications (Other)  Medication Sig   aspirin 81 MG chewable tablet CHEW AND SWALLOW 1 TABLET BY MOUTH TWICE DAILY FOR BLOOD CLOT PREVENTION   atorvastatin (LIPITOR) 20 MG tablet Take 20 mg by mouth daily.   EPINEPHrine (EPIPEN) 0.3 mg/0.3 mL IJ SOAJ injection Inject 0.3 mg into the muscle once as needed (allergic reaction).   lisinopril (ZESTRIL) 2.5 MG tablet Take 2.5 mg by mouth daily.   meloxicam (MOBIC) 15 MG tablet TAKE 1 TABLET BY MOUTH EVERY DAY AS NEEDED FOR PAIN   metFORMIN (GLUCOPHAGE) 500 MG tablet Take 1 tablet by mouth 3 (three) times daily.   cephALEXin  (KEFLEX ) 500 MG capsule Take 1 capsule (500 mg total) by mouth 4 (four) times daily. For 7 days  (Patient not taking: Reported on 12/26/2022)   HYDROcodone -acetaminophen  (NORCO/VICODIN) 5-325 MG tablet Take one tab po q 4-6 hrs prn pain (Patient not taking: Reported on 12/26/2022)   ibuprofen  (ADVIL ,MOTRIN ) 800 MG tablet Take 1 tablet (800 mg total) by mouth 3 (three) times daily. (Patient not taking: Reported on 12/26/2022)   No current facility-administered medications for this visit. (Other)   REVIEW OF SYSTEMS: ROS   Positive for: Gastrointestinal, Endocrine, Eyes, Respiratory Last edited by German Olam BRAVO, COT on 11/02/2023  8:32 AM.     ALLERGIES Allergies  Allergen Reactions   Sulfa Antibiotics Rash    rash   PAST MEDICAL HISTORY History reviewed. No pertinent past medical history. Past Surgical History:  Procedure Laterality Date   COLONOSCOPY N/A 08/04/2013   Procedure: COLONOSCOPY;  Surgeon: Claudis RAYMOND Rivet, MD;  Location: [facility];  Service: Endoscopy;  Laterality: N/A;  1025   COLONOSCOPY WITH PROPOFOL  N/A 02/13/2023   Procedure: COLONOSCOPY WITH PROPOFOL ;  Surgeon: Cinderella Deatrice FALCON, MD;  Location: [facility];  Service: Endoscopy;  Laterality: N/A;  8:15AM;ASA 1-2   d & c     TUBAL LIGATION     1998   FAMILY HISTORY History reviewed. No pertinent family history. SOCIAL HISTORY Social History   Tobacco Use   Smoking status: Never  Substance Use Topics   Alcohol use: No  Drug use: No       OPHTHALMIC EXAM:  Base Eye Exam     Visual Acuity (Snellen - Linear)       Right Left   Dist cc 20/20 -2 20/20 -1    Correction: Glasses         Tonometry (Tonopen, 8:43 AM)       Right Left   Pressure 10 11         Pupils       Dark Light Shape React APD   Right 3 2 Round Brisk None   Left 3 2 Round Brisk None         Visual Fields (Counting fingers)       Left Right    Full Full         Extraocular Movement       Right Left    Full, Ortho Full, Ortho         Neuro/Psych     Oriented x3: Yes   Mood/Affect: Normal          Dilation     Both eyes: 1.0% Mydriacyl, 2.5% Phenylephrine  @ 8:42 AM           Slit Lamp and Fundus Exam     External Exam       Right Left   External Normal Normal         Slit Lamp Exam       Right Left   Lids/Lashes Dermatochalasis - upper lid Dermatochalasis - upper lid   Conjunctiva/Sclera White and quiet White and quiet   Cornea Debris in tear film, trace Punctate epithelial erosions Debris in tear film, trace Punctate epithelial erosions   Anterior Chamber Deep and clear Deep and clear   Iris Round and dilated, No NVI Round and dilated, No NVI   Lens Clear Clear   Anterior Vitreous Vitreous syneresis Vitreous syneresis         Fundus Exam       Right Left   Disc Trace pallor, mild PPP Trace temporal pallor, mild PPP   C/D Ratio 0.4 0.4   Macula Flat, Good foveal reflex, RPE mottling, No heme or edema Flat, Good foveal reflex, RPE mottling, No heme or edema, flat hypopigmented choroidal lesion with irregular borders (aprox 2.d) in size, no orange pigment, no SRF   Vessels Vascular attenuation, Tortuous Vascular attenuation, Tortuous   Periphery Attached, No heme, mild cystoid degeneration Attached, No heme, mild cystoid degeneration, mild paving stone degeneration           Refraction     Wearing Rx       Sphere Cylinder Axis   Right -3.75 +1.25 005   Left -3.25 +0.75 178           IMAGING AND PROCEDURES  Imaging and Procedures for 11/02/2023  OCT, Retina - OU - Both Eyes       Right Eye Central Foveal Thickness: 247. Progression has no prior data. Findings include normal foveal contour, no IRF, no SRF, vitreomacular adhesion (No DME).   Left Eye Central Foveal Thickness: 234. Progression has no prior data. Findings include normal foveal contour, no IRF, no SRF, vitreomacular adhesion (Focal hyper reflective choroidal lesion temporal mac caught best on widefield, no DME).   Notes *Images captured and stored on  drive  Diagnosis / Impression:  NFP; no IRF/SRF OU; No DME OU OS: Focal hyper reflective choroidal lesion temporal mac caught best on widefield, no DME  Clinical  management:  See below  Abbreviations: NFP - Normal foveal profile. CME - cystoid macular edema. PED - pigment epithelial detachment. IRF - intraretinal fluid. SRF - subretinal fluid. EZ - ellipsoid zone. ERM - epiretinal membrane. ORA - outer retinal atrophy. ORT - outer retinal tubulation. SRHM - subretinal hyper-reflective material. IRHM - intraretinal hyper-reflective material      Color Fundus Photography Optos - OU - Both Eyes       Right Eye Progression has no prior data. Disc findings include normal observations. Macula : normal observations. Vessels : normal observations. Periphery : normal observations.   Left Eye Progression has no prior data. Disc findings include normal observations. Macula : retinal pigment epithelium abnormalities (Hypo pigmented choroidal lesion with irregular border temporal macula). Vessels : normal observations.   Notes *Images captured and stored on drive  Diagnosis / Impression:  OS: Hypo pigmented choroidal lesion with irregular border temporal macula  Clinical management:  See below  Abbreviations: NFP - Normal foveal profile. CME - cystoid macular edema. PED - pigment epithelial detachment. IRF - intraretinal fluid. SRF - subretinal fluid. EZ - ellipsoid zone. ERM - epiretinal membrane. ORA - outer retinal atrophy. ORT - outer retinal tubulation. SRHM - subretinal hyper-reflective material. IRHM - intraretinal hyper-reflective material           ASSESSMENT/PLAN:   ICD-10-CM   1. 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 Diabetes mellitus type 2 without retinopathy (HCC)  E11.9 OCT, Retina - OU - Both Eyes    3. Diabetes mellitus treated with oral medication (HCC)  E11.9    Z79.84      Choroidal  lesion, OS  - flat hypopigmented choroidal lesion with irregular borders (aprox 2.d) in size temporal macula -- no orange pigment, no SRF - OCT OS: Hypo pigmented choroidal lesion with irregular borders temporal macula - previously monitored by Dr. Darroll  - discussed findings, prognosis - no retinal or ophthalmic interventions indicated or recommended   - recommend monitoring  - f/u 4-6 months, DFE, OCT, Optos color photos   2,3. Diabetes mellitus, type 2 without retinopathy  - BCVA 20/20 OU - The incidence, risk factors for progression, natural history and treatment options for diabetic retinopathy were discussed with patient.   - The need for close monitoring of blood glucose, blood pressure, and serum lipids, avoiding cigarette or any type of tobacco, and the need for long term follow up was also discussed with patient. - OCT shows no DME OU - f/u in 1 year, sooner prn     Ophthalmic Meds Ordered this visit:  No orders of the defined types were placed in this encounter.    Return in about 6 months (around 05/02/2024) for f/u Choridal lesion OS, DFE, OCT, Optos color photos.  There are no Patient Instructions on file for this visit.  Explained the diagnoses, plan, and follow up with the patient and they expressed understanding.  Patient expressed understanding of the importance of proper follow up care.    This document serves as a record of services personally performed by Redell JUDITHANN Hans, MD, PhD. It was created on their behalf by Avelina Pereyra, COA an ophthalmic technician. The creation of this record is the provider's dictation and/or activities during the visit.   Electronically signed by: Avelina GORMAN Pereyra, COT  11/02/23  9:47 AM   This document serves as a record  of services personally performed by Redell JUDITHANN Hans, MD, PhD. It was created on their behalf by Wanda GEANNIE Keens, COT an ophthalmic technician. The creation of this record is the provider's dictation and/or  activities during the visit.    Electronically signed by:  Wanda GEANNIE Keens, COT  11/02/23 9:47 AM  Redell JUDITHANN Hans, M.D., Ph.D. Diseases & Surgery of the Retina and Vitreous [facility] 11/02/2023  I have reviewed the above documentation for accuracy and completeness, and I agree with the above. Redell JUDITHANN Hans, M.D., Ph.D. 11/02/23 9:47 AM   Abbreviations: M myopia (nearsighted); A astigmatism; H hyperopia (farsighted); P presbyopia; Mrx spectacle prescription;  CTL contact lenses; OD right eye; OS left eye; OU both eyes  XT exotropia; ET esotropia; PEK punctate epithelial keratitis; PEE punctate epithelial erosions; DES dry eye syndrome; MGD meibomian gland dysfunction; ATs artificial tears; PFAT's preservative free artificial tears; NSC nuclear sclerotic cataract; PSC posterior subcapsular cataract; ERM epi-retinal membrane; PVD posterior vitreous detachment; RD retinal detachment; DM diabetes mellitus; DR diabetic retinopathy; NPDR non-proliferative diabetic retinopathy; PDR proliferative diabetic retinopathy; CSME clinically significant macular edema; DME diabetic macular edema; dbh dot blot hemorrhages; CWS cotton wool spot; POAG primary open angle glaucoma; C/D cup-to-disc ratio; HVF humphrey visual field; GVF goldmann visual field; OCT optical coherence tomography; IOP intraocular pressure; BRVO Branch retinal vein occlusion; CRVO central retinal vein occlusion; CRAO central retinal artery occlusion; BRAO branch retinal artery occlusion; RT retinal tear; SB scleral buckle; PPV pars plana vitrectomy; VH Vitreous hemorrhage; PRP panretinal laser photocoagulation; IVK intravitreal kenalog; VMT vitreomacular traction; MH Macular hole;  NVD neovascularization of the disc; NVE neovascularization elsewhere; AREDS age related eye disease study; ARMD age related macular degeneration; POAG primary open angle glaucoma; EBMD epithelial/anterior basement membrane dystrophy; ACIOL  anterior chamber intraocular lens; IOL intraocular lens; PCIOL posterior chamber intraocular lens; Phaco/IOL phacoemulsification with intraocular lens placement; PRK photorefractive keratectomy; LASIK laser assisted in situ keratomileusis; HTN hypertension; DM diabetes mellitus; COPD chronic obstructive pulmonary disease

## 2023-10-30 ENCOUNTER — Other Ambulatory Visit (HOSPITAL_COMMUNITY)
Admission: RE | Admit: 2023-10-30 | Discharge: 2023-10-30 | Disposition: A | Discharge status: Home / Self Care | Payer: Self-pay | Source: Ambulatory Visit | Attending: Oncology | Admitting: Oncology

## 2023-11-02 ENCOUNTER — Ambulatory Visit (INDEPENDENT_AMBULATORY_CARE_PROVIDER_SITE_OTHER): Admitting: Ophthalmology

## 2023-11-02 ENCOUNTER — Encounter (INDEPENDENT_AMBULATORY_CARE_PROVIDER_SITE_OTHER): Payer: Self-pay | Admitting: Ophthalmology

## 2023-11-02 DIAGNOSIS — H3581 Retinal edema: Secondary | ICD-10-CM

## 2023-11-02 DIAGNOSIS — H318 Other specified disorders of choroid: Secondary | ICD-10-CM | POA: Diagnosis not present

## 2023-11-02 DIAGNOSIS — E119 Type 2 diabetes mellitus without complications: Secondary | ICD-10-CM | POA: Diagnosis not present

## 2023-11-02 DIAGNOSIS — Z7984 Long term (current) use of oral hypoglycemic drugs: Secondary | ICD-10-CM | POA: Diagnosis not present

## 2023-11-09 LAB — GENECONNECT MOLECULAR SCREEN: Genetic Analysis Overall Interpretation: NEGATIVE

## 2023-12-01 DIAGNOSIS — R030 Elevated blood-pressure reading, without diagnosis of hypertension: Secondary | ICD-10-CM | POA: Diagnosis not present

## 2023-12-01 DIAGNOSIS — M7918 Myalgia, other site: Secondary | ICD-10-CM | POA: Diagnosis not present

## 2024-01-04 DIAGNOSIS — Z6830 Body mass index (BMI) 30.0-30.9, adult: Secondary | ICD-10-CM | POA: Diagnosis not present

## 2024-01-04 DIAGNOSIS — Z299 Encounter for prophylactic measures, unspecified: Secondary | ICD-10-CM | POA: Diagnosis not present

## 2024-01-04 DIAGNOSIS — E7800 Pure hypercholesterolemia, unspecified: Secondary | ICD-10-CM | POA: Diagnosis not present

## 2024-01-04 DIAGNOSIS — E119 Type 2 diabetes mellitus without complications: Secondary | ICD-10-CM | POA: Diagnosis not present

## 2024-01-04 DIAGNOSIS — J45909 Unspecified asthma, uncomplicated: Secondary | ICD-10-CM | POA: Diagnosis not present

## 2024-01-04 DIAGNOSIS — I10 Essential (primary) hypertension: Secondary | ICD-10-CM | POA: Diagnosis not present

## 2024-02-03 ENCOUNTER — Ambulatory Visit: Admitting: Physician Assistant

## 2024-05-02 ENCOUNTER — Encounter (INDEPENDENT_AMBULATORY_CARE_PROVIDER_SITE_OTHER): Admitting: Ophthalmology
